# Patient Record
Sex: Male | Born: 1965
Health system: Southern US, Community
[De-identification: ages and names within clinical notes are randomized; demographics above are authoritative.]

## PROBLEM LIST (undated history)

## (undated) DIAGNOSIS — G4730 Sleep apnea, unspecified: Secondary | ICD-10-CM

## (undated) DIAGNOSIS — Z8639 Personal history of other endocrine, nutritional and metabolic disease: Secondary | ICD-10-CM

## (undated) DIAGNOSIS — I440 Atrioventricular block, first degree: Secondary | ICD-10-CM

## (undated) DIAGNOSIS — Z720 Tobacco use: Secondary | ICD-10-CM

## (undated) HISTORY — PX: MENISCUS REPAIR: SHX5179

## (undated) HISTORY — DX: Tobacco use: Z72.0

## (undated) HISTORY — DX: Personal history of other endocrine, nutritional and metabolic disease: Z86.39

## (undated) HISTORY — DX: Atrioventricular block, first degree: I44.0

## (undated) HISTORY — DX: Sleep apnea, unspecified: G47.30

## (undated) HISTORY — PX: TONSILLECTOMY: SUR1361

---

## 2004-09-09 DIAGNOSIS — Z8639 Personal history of other endocrine, nutritional and metabolic disease: Secondary | ICD-10-CM

## 2004-09-09 HISTORY — DX: Personal history of other endocrine, nutritional and metabolic disease: Z86.39

## 2004-09-09 HISTORY — PX: THYROIDECTOMY, PARTIAL: SHX18

## 2005-08-07 ENCOUNTER — Other Ambulatory Visit: Admission: RE | Admit: 2005-08-07 | Discharge: 2005-08-07 | Payer: Self-pay | Admitting: Interventional Radiology

## 2005-08-07 ENCOUNTER — Encounter: Admission: RE | Admit: 2005-08-07 | Discharge: 2005-08-07 | Payer: Self-pay | Admitting: Emergency Medicine

## 2005-08-07 ENCOUNTER — Encounter (INDEPENDENT_AMBULATORY_CARE_PROVIDER_SITE_OTHER): Payer: Self-pay | Admitting: Specialist

## 2005-08-21 ENCOUNTER — Ambulatory Visit (HOSPITAL_COMMUNITY): Admission: AD | Admit: 2005-08-21 | Discharge: 2005-08-22 | Payer: Self-pay | Admitting: Surgery

## 2005-08-21 ENCOUNTER — Encounter (INDEPENDENT_AMBULATORY_CARE_PROVIDER_SITE_OTHER): Payer: Self-pay | Admitting: *Deleted

## 2006-04-10 IMAGING — US US BIOPSY
1 series · 8 of 8 positions shown · non-contrast
Comparison: none

CLINICAL DATA: Right thyroid nodule noted on physical exam. 

ULTRASOUND GUIDED FNA BIOPSY THYROID:
TECHNIQUE: Overlying skin prepped with Betadine, draped in usual sterile
fashion, and infiltrated locally with 1% lidocaine. Under real-time ultrasound
guidance, 4 passes were made into the dominant right mid pole nodule using 25
gauge needles. Samples were given to cytopathology. No immediate complication.

[Series 1: unknown · 0.07mm/px · 8 of 8 slices shown]
[im 1/8]
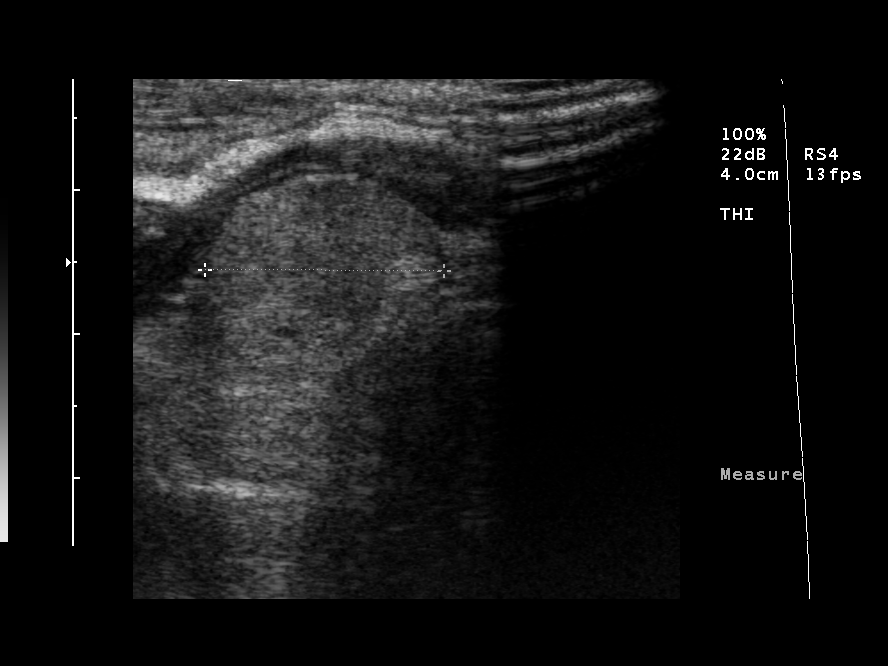
[im 2/8]
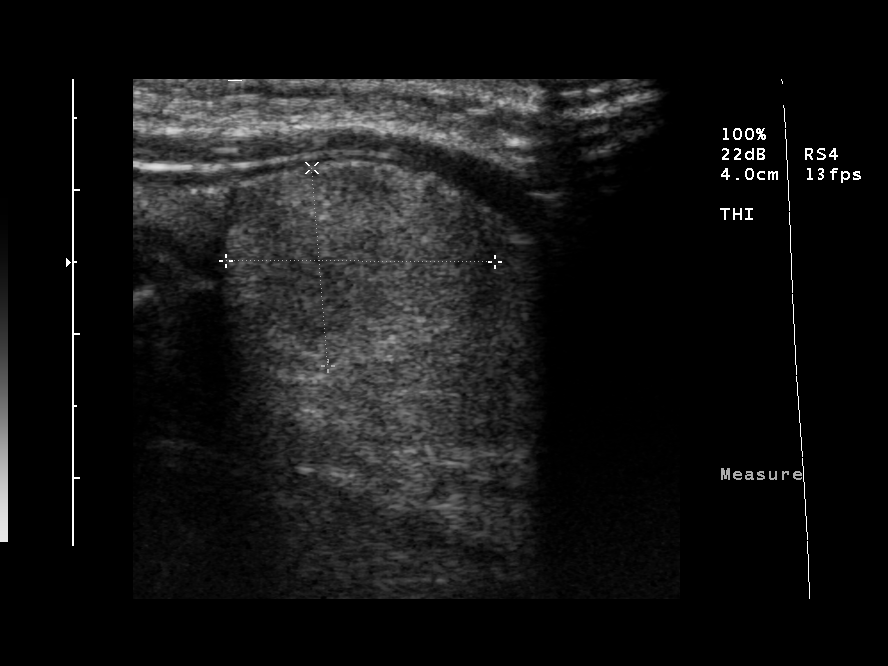
[im 3/8]
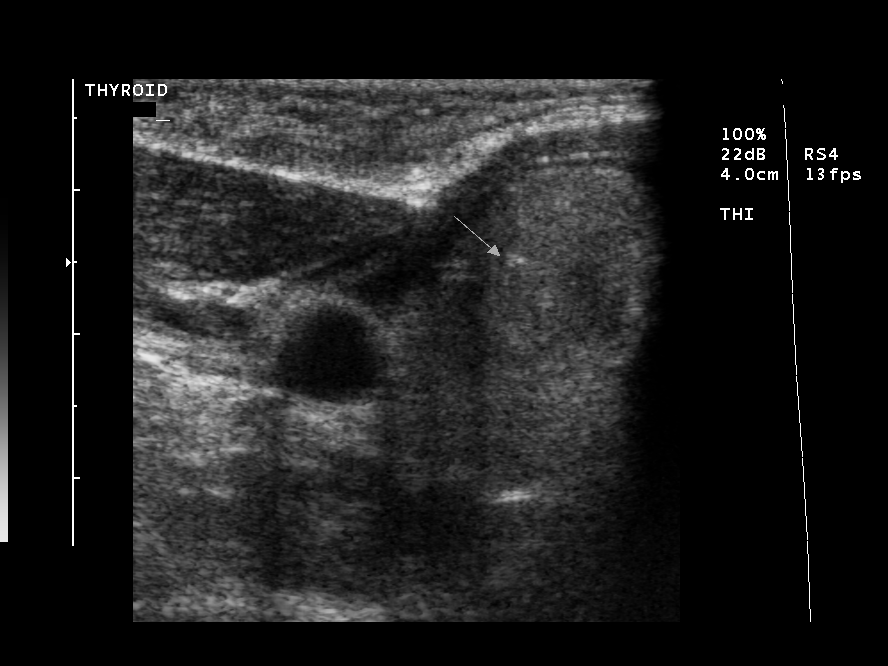
[im 4/8]
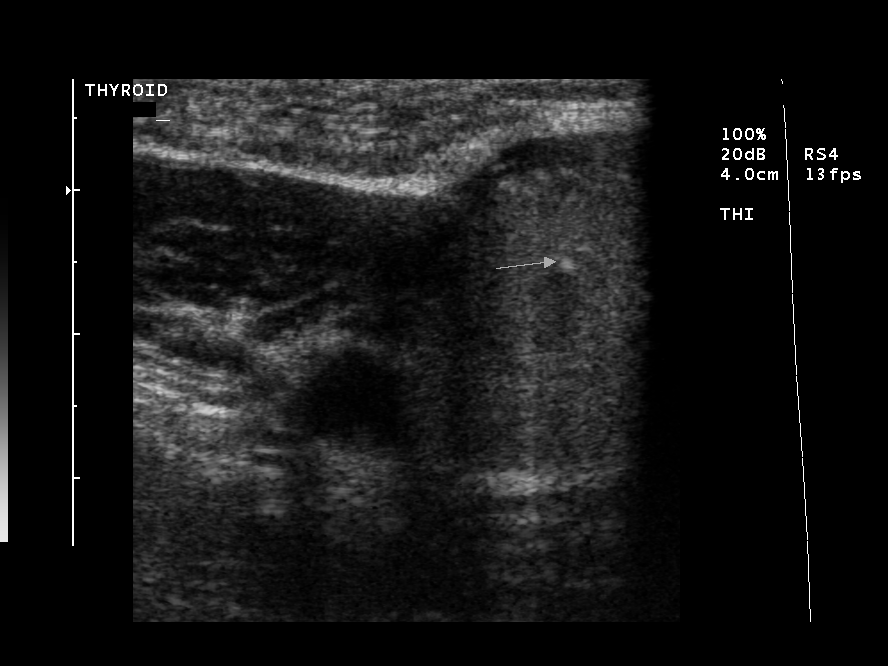
[im 5/8]
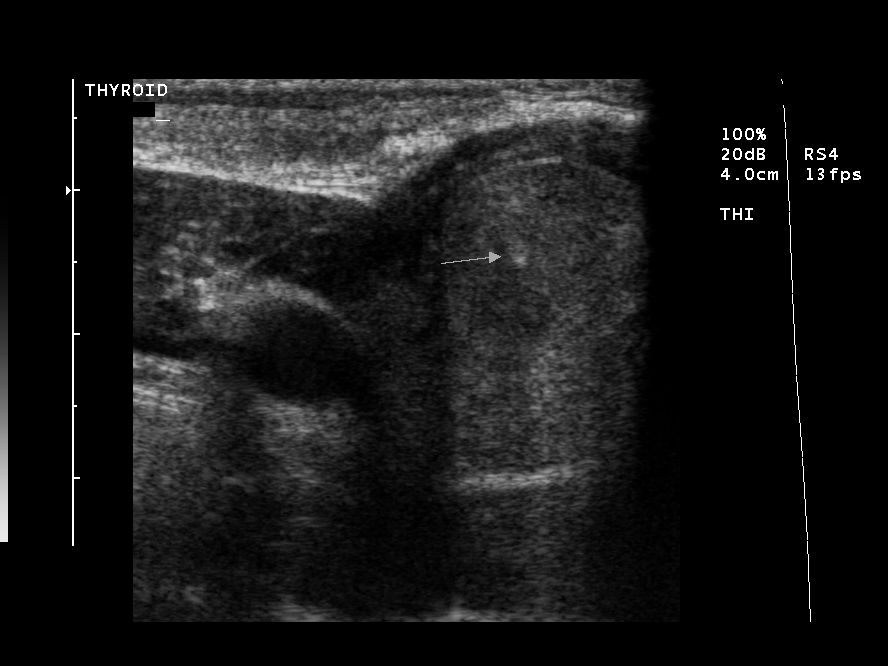
[im 6/8]
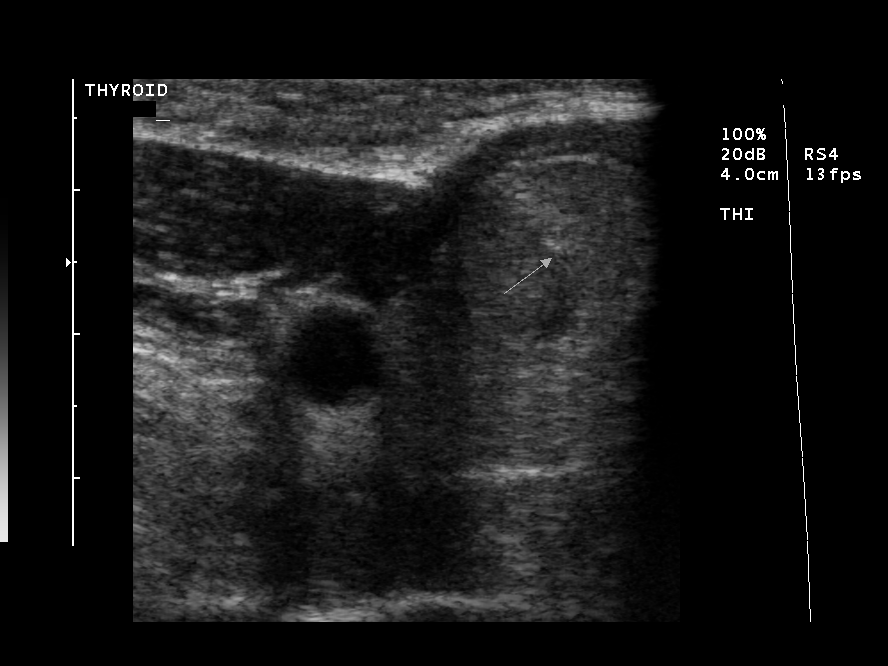
[im 7/8]
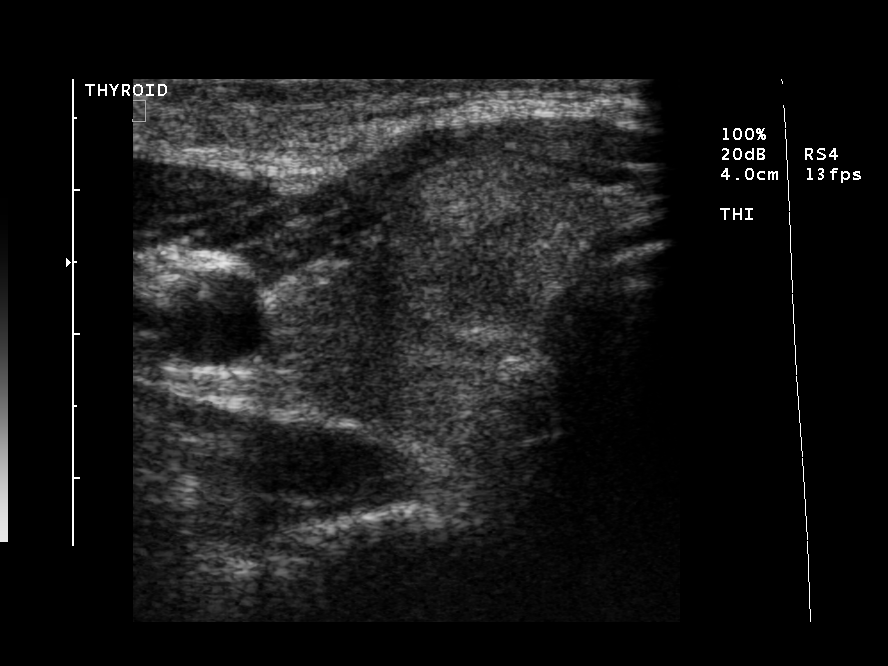
[im 8/8]
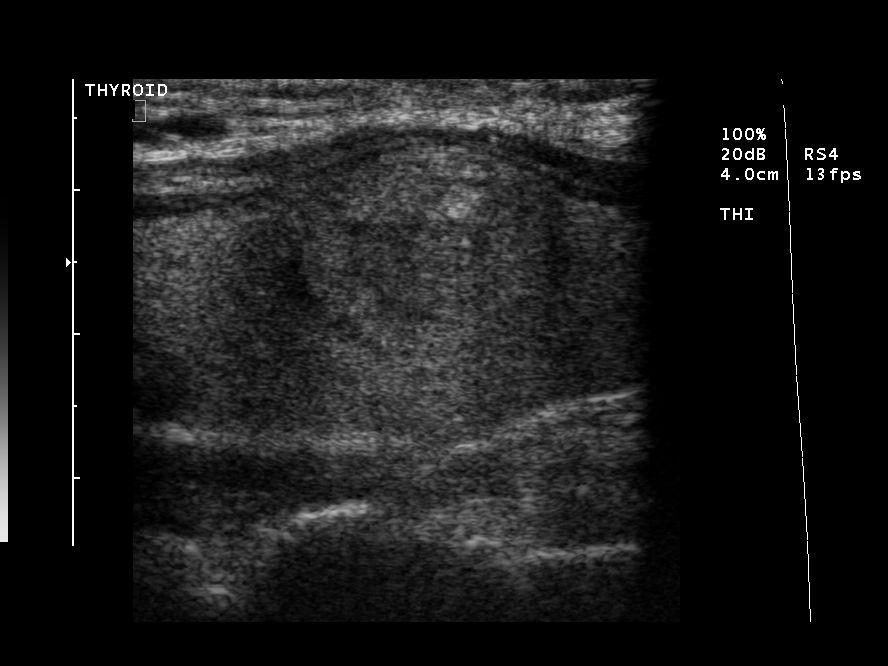

[8 of 8 positions shown; findings below may reference images not displayed]

IMPRESSION: Technically successful ultrasound guided FNA thyroid biopsy.

## 2007-01-28 ENCOUNTER — Ambulatory Visit: Payer: Self-pay | Admitting: Internal Medicine

## 2007-01-28 LAB — CONVERTED CEMR LAB
T3, Free: 2.9 pg/mL (ref 2.3–4.2)
T4, Total: 5.5 ug/dL (ref 5.0–12.5)
TSH: 3.08 microintl units/mL (ref 0.35–5.50)

## 2007-04-08 ENCOUNTER — Ambulatory Visit (HOSPITAL_BASED_OUTPATIENT_CLINIC_OR_DEPARTMENT_OTHER): Admission: RE | Admit: 2007-04-08 | Discharge: 2007-04-08 | Payer: Self-pay | Admitting: Internal Medicine

## 2007-04-16 ENCOUNTER — Ambulatory Visit: Payer: Self-pay | Admitting: Internal Medicine

## 2007-04-22 ENCOUNTER — Ambulatory Visit: Payer: Self-pay | Admitting: Pulmonary Disease

## 2007-07-21 ENCOUNTER — Encounter: Payer: Self-pay | Admitting: Internal Medicine

## 2007-11-26 ENCOUNTER — Encounter: Payer: Self-pay | Admitting: Internal Medicine

## 2007-11-27 ENCOUNTER — Encounter: Payer: Self-pay | Admitting: Internal Medicine

## 2008-05-17 ENCOUNTER — Ambulatory Visit: Payer: Self-pay | Admitting: Internal Medicine

## 2008-05-26 ENCOUNTER — Telehealth: Payer: Self-pay | Admitting: Internal Medicine

## 2008-09-16 ENCOUNTER — Ambulatory Visit (HOSPITAL_BASED_OUTPATIENT_CLINIC_OR_DEPARTMENT_OTHER): Admission: RE | Admit: 2008-09-16 | Discharge: 2008-09-16 | Payer: Self-pay | Admitting: Internal Medicine

## 2008-09-16 ENCOUNTER — Ambulatory Visit: Payer: Self-pay | Admitting: Internal Medicine

## 2008-09-16 ENCOUNTER — Ambulatory Visit: Payer: Self-pay | Admitting: Diagnostic Radiology

## 2008-09-16 DIAGNOSIS — J189 Pneumonia, unspecified organism: Secondary | ICD-10-CM | POA: Insufficient documentation

## 2008-09-20 ENCOUNTER — Telehealth: Payer: Self-pay | Admitting: Internal Medicine

## 2011-01-17 ENCOUNTER — Encounter: Payer: Self-pay | Admitting: Internal Medicine

## 2011-01-18 ENCOUNTER — Encounter: Payer: Self-pay | Admitting: Internal Medicine

## 2011-01-22 NOTE — Procedures (Signed)
NAMEHERNDON, GRILL            ACCOUNT NO.:  [id number]   MEDICAL RECORD NO.:  [id number]          PATIENT TYPE:  OUT   LOCATION:  SLEEP CENTER                 FACILITY:  [facility]   PHYSICIAN:  Barbaraann Share, MD,FCCPDATE OF BIRTH:  28-Sep-1967   DATE OF STUDY:  04/08/2007                            NOCTURNAL POLYSOMNOGRAM   REFERRING PHYSICIAN:   INDICATION FOR STUDY:  Hypersomnia with sleep apnea.   EPWORTH SLEEPINESS SCORE:  16   MEDICATIONS:   SLEEP ARCHITECTURE:  The patient had a total sleep time of 375 minutes  with very little slow wave sleep as well as REM.  Sleep onset latency  was normal as was REM onset.  Sleep efficiency was decreased at 85%.   RESPIRATORY DATA:  The patient was found to have 52 obstructive  hypopneas and 5 obstructive apneas for an apnea/hypopnea index of 9  events per hour.  The events occurred primarily in the supine position.  And there was moderate to loud snoring noted throughout.   OXYGEN DATA:  There was O2 desaturation as low as 88% with the patient's  obstructive events.   CARDIAC DATA:  Rare PVCs were noted but no clinically significant  arrhythmias.   MOVEMENT-PARASOMNIA:  The patient was found to have 28 leg jerks with 3  per hour resulting in arousal or awakening.  This is most likely due to  the patient's sleep disordered breathing.   IMPRESSIONS-RECOMMENDATIONS:  1. Mild obstructive sleep apnea/hypopnea syndrome with an      apnea/hypopnea index of 9 events per hour and oxygen desaturation      as low as 88%.  The events occurred primarily in the supine      position.  The patient's degree of sleep apnea may be somewhat      under estimated secondary to the small quantities of slow wave      sleep      and REM.  Treatment for this degree of sleep apnea can include      weight loss alone is applicable, upper airway surgery, oral      appliance, and also CPAP.  Clinical correlation is suggested.      Barbaraann Share,  MD,FCCP  Diplomate, American Board of Sleep  Medicine  Electronically Signed     KMC/MEDQ  D:  04/22/2007 16:22:34  T:  04/23/2007 20:46:09  Job:  [id number]

## 2011-01-22 NOTE — Assessment & Plan Note (Signed)
[facility]                           PRIMARY CARE OFFICE NOTE   NAME:Jeffrey Moses, Jeffrey Moses                   MRN:          [id number]  DATE:01/28/2007                            DOB:          07-20-1966    CHIEF COMPLAINT:  New patient to practice.   HISTORY OF PRESENT ILLNESS:  The patient is a 45 year old white male  here to establish primary care.  He has lived in [place] for the last  4 years.  He is originally from [place], [place] and previously in  [place] prior to moving to [place].  He works as a Clinical cytogeneticist.  He has been in good health for most of his life.  He notes  partial thyroidectomy in December 2006 due to right-sided goiter.  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  Final pathology from subtotal right thyroidectomy revealed  benign thyroid tissue, according to the patient.  He has not had  followup thyroid studies since that time; however, denies any weight  gain or any heat/cold intolerance or constipation.   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  He does admit  to daytime somnolence and easily nods off during the day.  He also  reports occasional a.m. headache.  He reports that he has tried to lose  approximately 10-15 pounds within the last 6 months, which has decreased  his snoring, but is interested in obtaining a sleep study.   His other concern is the chronic skin lesion on the tip of his penis  that he has had for many, many years.  He was previously seen by a  physician in [place], who recommended followup with a urologist; this  never happened.  He denies any symptoms.  It is not pruritic.  It has  slightly enlarged in size.   PAST MEDICAL HISTORY SUMMARY:  1. Partial right thyroidectomy, December  2006, for right-sided goiter.  2. Possible sleep apnea with excessive snoring.   CURRENT MEDICATIONS:  1. Multivitamin 3 times a week.  2. Omega-3 fatty acids 3 times per week.   ALLERGIES TO MEDICATIONS:  None known.   SOCIAL HISTORY:  The patient has been married for 10 years.  He has 2  children, ages 28 and 3 and currently works as a IT sales professional.   FAMILY HISTORY:  Mother and father are both in their 78s and are in good  health.  Grandmother is noted to be diabetic.  There is a report of skin  cancer in the family in his father.   HABITS:  He seldom drinks.  He does not smoke.  He did chew tobacco for  17 years; he quit in 2001.  He reports regular followup with his dentist  and no report of oral lesions/oral cancer.   REVIEW OF SYSTEMS:  As noted above, he denies any chest pain, shortness  of breath.  No heartburn, nausea, vomiting, constipation or diarrhea.  No  sexual dysfunction and all other systems negative.   PHYSICAL EXAMINATION:  VITALS:  Weight is 202 pounds.  Temperature is  96.7, pulse is 59, BP is 127/80 in the left arm in a seated position.  GENERAL:  The patient is a pleasant, well-developed, well-nourished 45-  year-old white male in no apparent distress.  HEENT:  Normocephalic, atraumatic.  Pupils are equal and reactive to  light bilaterally.  Extraocular motility was intact.  The patient was  anicteric.  Conjunctivae were within normal limits.  External auditory  canals and tympanic membranes were clear bilaterally.  Hearing was  grossly normal.  Oropharyngeal exam was benign.  His oropharynx was  somewhat crowded.  NECK:  Supple.  Unable to appreciate any thyromegaly or thyroid nodules.  No carotid bruit.  CHEST:  Normal inspiratory effort.  Chest was clear to auscultation  bilaterally, no rhonchi, rales or wheezing.  CARDIOVASCULAR:  Regular rate and rhythm.  No significant murmurs, rubs,  or gallops appreciated.  ABDOMEN:  Soft and nontender.  Positive  bowel sounds.  No organomegaly.  GENITALIA:  Raised papular lesions near the urethral meatus that have  somewhat purplish discoloration; there are approximately 5-6 papular  lesions surrounding his meatus.  No other skin lesions are noted in the  shaft of his penis and no testicular abnormalities are noted.  MUSCULOSKELETAL:  No clubbing, cyanosis, or edema.  NEUROLOGIC:  Cranial nerves II-XII were grossly intact.  He was nonfocal  and his mood and affect were appropriate.   IMPRESSION/RECOMMENDATION:  1. Excessive snoring, possible obstructive sleep apnea.  2. Penile skin lesions, rule out squamous cell cancer.  3. History of right partial thyroidectomy.  4. Health maintenance.   RECOMMENDATIONS:  The patient will be referred for outpatient sleep  study.  We discussed the possibility of using CPAP if he is positive for  sleep apnea.  He is to continue his weight loss efforts and hopefully  losing another 10 or 15 pounds may help his possible sleep apnea/snoring  symptoms.   In terms of his penile skin lesions, he will be referred to [facility] for followup/biopsy.   I reviewed his cholesterol studies; his LDL was mildly elevated in the  150s.  I recommended a low-saturated-fat diet.  Followup time is in  approximately 2 months.    Barbette Hair. Artist Pais, DO  Electronically Signed   RDY/MedQ  DD: 01/28/2007  DT: 01/28/2007  Job #: [id number]

## 2011-01-25 NOTE — Op Note (Signed)
Jeffrey Moses, WEISSINGER            ACCOUNT NO.:  [id number]   MEDICAL RECORD NO.:  [id number]          PATIENT TYPE:  OIB   LOCATION:  5729                         FACILITY:  [facility]   PHYSICIAN:  Thornton Park. Daphine Deutscher, MD  DATE OF BIRTH:  1966-05-02   DATE OF PROCEDURE:  08/21/2005  DATE OF DISCHARGE:                                 OPERATIVE REPORT   PREOPERATIVE DIAGNOSIS:  Right thyroid nodule.   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   PROCEDURE:  Right thyroid lobectomy and isthmusectomy.   SURGEON:  Thornton Park. Daphine Deutscher, MD.   ASSISTANTMarland Kitchen  Baruch Merl.   ANESTHESIA:  General endotracheal.   DESCRIPTION OF PROCEDURE:  The patient was taken to room 17 and given  general anesthesia. The patient was placed with a roll between his shoulders  and then hyperextended. Approximately 2-1/2 fingerbreadths above the sternal  notch I made a transverse incision, initially just from the  sternocleidomastoid muscles.  I then extended it and carried this down to  and through the platysma muscle. I raised the flaps superiorly and  inferiorly and put in the Horner retractor. The midline was identified and  incised, and then I got in beneath the strap muscles and retracted those  laterally and mobilized the right thyroid. I used a Pension scheme manager to  tease the thyroid from the surrounding tissue. Laterally I stayed right on  the gland and ligated the vascular supply to the inferior thyroid, middle  thyroid vein, and also the superior polar vessels with a combination of  small clips and 2-0 silk ties. Near where the nerve would be entering, I  stayed right on the gland and again teased it away; and eventually  mobilizing anteriorly across into the isthmus, where I took it with a Kelly  clamp (which I resewed with a 2-0 Vicryl). A piece of Surgicel was placed  down within the area of the inferior thyroid vessels. I had spared the  parathyroid glands and no  bleeding was noted. I waited until the frozen  section was called. Surgicel was left in the wound and I closed the strap  muscles with 4-0 Vicryls.  Then I closed the subcutaneous tissue with 4-0  Vicryls subcutaneously, and then subcuticularly and  then Benzoin and Steri-Strips on the skin. The patient seemed to tolerate  this procedure well. He was  awakened and taken to recovery room in  satisfactory addition.   FROZEN SECTION DIAGNOSIS:  Benign right thyroid mass, status post thyroid  lobectomy and isthmusectomy.      Thornton Park Daphine Deutscher, MD  Electronically Signed     MBM/MEDQ  D:  08/21/2005  T:  08/22/2005  Job:  [id number]   cc:   Brett Canales A. Cleta Alberts, M.D.  Fax: [phone]

## 2011-01-29 ENCOUNTER — Encounter: Payer: Self-pay | Admitting: Internal Medicine

## 2011-02-12 ENCOUNTER — Encounter: Payer: Self-pay | Admitting: Internal Medicine

## 2011-04-16 ENCOUNTER — Ambulatory Visit (HOSPITAL_BASED_OUTPATIENT_CLINIC_OR_DEPARTMENT_OTHER)
Admission: RE | Admit: 2011-04-16 | Discharge: 2011-04-16 | Disposition: A | Discharge status: Home / Self Care | Payer: 59 | Source: Ambulatory Visit | Attending: Family | Admitting: Family

## 2011-04-16 ENCOUNTER — Telehealth: Payer: Self-pay | Admitting: Family

## 2011-04-16 ENCOUNTER — Other Ambulatory Visit: Payer: Self-pay | Admitting: Family

## 2011-04-16 ENCOUNTER — Ambulatory Visit (INDEPENDENT_AMBULATORY_CARE_PROVIDER_SITE_OTHER): Payer: 59 | Admitting: Family

## 2011-04-16 ENCOUNTER — Encounter: Payer: Self-pay | Admitting: Family

## 2011-04-16 ENCOUNTER — Ambulatory Visit (INDEPENDENT_AMBULATORY_CARE_PROVIDER_SITE_OTHER)
Admission: RE | Admit: 2011-04-16 | Discharge: 2011-04-16 | Disposition: A | Discharge status: Home / Self Care | Payer: 59 | Source: Ambulatory Visit | Attending: Family | Admitting: Family

## 2011-04-16 VITALS — BP 108/78 | HR 78 | Temp 97.5°F | Resp 16 | Ht 74.02 in | Wt 218.1 lb

## 2011-04-16 DIAGNOSIS — N453 Epididymo-orchitis: Secondary | ICD-10-CM | POA: Insufficient documentation

## 2011-04-16 DIAGNOSIS — N509 Disorder of male genital organs, unspecified: Secondary | ICD-10-CM

## 2011-04-16 DIAGNOSIS — N50811 Right testicular pain: Secondary | ICD-10-CM

## 2011-04-16 MED ORDER — CEFTRIAXONE SODIUM 250 MG IJ SOLR
250.0000 mg | Freq: Once | INTRAMUSCULAR | Status: AC
  Administered 2011-04-16: 250 mg via INTRAMUSCULAR

## 2011-04-16 MED ORDER — LEVOFLOXACIN 500 MG PO TABS: 500.0000 mg | ORAL_TABLET | Freq: Every day | ORAL | Status: DC

## 2011-04-16 NOTE — Patient Instructions (Signed)
Please go directly to imaging department complete scrotal ultrasound on the first floor (bear left off of elevators). Start Levaquin today (rx was sent to [facility]). Call if worsening pain, swelling, or if fever >101. Follow up in 1 week.

## 2011-04-16 NOTE — Progress Notes (Signed)
  Subjective:    Patient ID: Jeffrey Moses, male    DOB: Oct 06, 1965, 45 y.o.   MRN: [id number]  HPI    Review of Systems     Objective:   Physical Exam        Assessment & Plan:  Reviewed results of scrotal ultrasound: right epididymo-orchitis.  Pt informed of results.  Case and plan was reviewed with Dr. Rodena Medin.

## 2011-04-16 NOTE — Assessment & Plan Note (Signed)
Suspect orchitis.  Will plan to treat with IM ceftriaxone- given in office today, followed by a 10 day course of levaquin. Check scrotal ultrasound to exclude testicular torsion and further evaluate.  Pt to f/u in 1 week.

## 2011-04-16 NOTE — Telephone Encounter (Signed)
Reviewed findings of ultrasound with pt.  Recommended that he start levaquin ASAP and contact us if symptoms worsen or if they do not improve in 2-3 days.  Pt verbalizes understanding.

## 2011-04-16 NOTE — Progress Notes (Signed)
  Subjective:    Patient ID: Jeffrey Moses, male    DOB: 17-Jun-1966, 44 y.o.   MRN: [id number]  HPI  Mr.  Moses is a 45 yr old male who presents chief complaint of right testicular swelling.  Symptoms started 24 hours ago.  Denies fever, dysuria, polyuria.  Denies inguinal LAD.  No new sexual partners.  Denies previous history of similar symptoms.  Denies trauma or injury to the right testicle.     Review of Systems See HPI  Past Medical History  Diagnosis Date  . History of thyroid nodule 2006    benign  . Chewing tobacco use     history of chewing tobacco x 17 years  . Heart block, first degree     History   Social History  . Marital Status: Married    Spouse Name: N/A    Number of Children: N/A  . Years of Education: N/A   Occupational History  . Not on file.   Social History Main Topics  . Smoking status: Never Smoker   . Smokeless tobacco: Former Neurosurgeon    Types: Chew    Quit date: 09/10/1999  . Alcohol Use: Not on file     seldom  . Drug Use: Not on file  . Sexually Active: Not on file   Other Topics Concern  . Not on file   Social History Narrative     Married   Three children 74, 27, 30 month old   Firefighter   Alcohol - seldom   Previously chewed tobacco x 17 yrs.  Quit 2001     Past Surgical History  Procedure Date  . Thyroidectomy, partial 2006    Family History  Problem Relation Age of Onset  . Diabetes    . Cancer Other     skin    No Known Allergies  No current outpatient prescriptions on file prior to visit.    BP 108/78  Pulse 78  Temp(Src) 97.5 F (36.4 C) (Oral)  Resp 16  Ht 6' 2.02" (1.88 m)  Wt 218 lb 1.3 oz (98.92 kg)  BMI 27.99 kg/m2       Objective:   Physical Exam  Constitutional: He appears well-developed and well-nourished.  Genitourinary: Penis normal.             Assessment & Plan:

## 2011-04-17 ENCOUNTER — Telehealth: Payer: Self-pay | Admitting: Family

## 2011-04-17 LAB — GC/CHLAMYDIA PROBE AMP, URINE
Chlamydia, Swab/Urine, PCR: NEGATIVE
GC Probe Amp, Urine: NEGATIVE

## 2011-04-17 NOTE — Telephone Encounter (Signed)
Called patient to see how he was feeling.  He told me that his symptoms are improving.  Asked pt to call if his symptoms do not continue to improve.  He verbalized understanding.

## 2011-04-18 ENCOUNTER — Telehealth: Payer: Self-pay | Admitting: *Deleted

## 2011-04-18 ENCOUNTER — Telehealth: Payer: Self-pay | Admitting: Family

## 2011-04-18 LAB — URINE CULTURE
Colony Count: NO GROWTH
Organism ID, Bacteria: NO GROWTH

## 2011-04-18 NOTE — Telephone Encounter (Signed)
Message copied by Kathi Simpers on Thu Apr 18, 2011  1:51 PM ------      Message from: [name], MELISSA      Created: Wed Apr 17, 2011  3:19 PM       Could you pls make sure that his urine culture is pending? thanks

## 2011-04-18 NOTE — Telephone Encounter (Signed)
Please notify pt that his gonorrhea/chlamydia testing was normal.

## 2011-04-18 NOTE — Telephone Encounter (Signed)
Pt.notified

## 2011-04-18 NOTE — Telephone Encounter (Signed)
Spoke to Manjul at [facility] and was told that urine culture is still pending. Was transferred to microbiology voicemail, left message to call us with status of culture.

## 2011-04-19 NOTE — Telephone Encounter (Signed)
Call placed to [facility] [phone], spoke with Nida Boatman he stated there were final results, which were requested to be faxed.

## 2011-04-19 NOTE — Telephone Encounter (Signed)
Lab results received - colony count-no growth noted, final report no report

## 2011-04-23 ENCOUNTER — Telehealth: Payer: Self-pay | Admitting: Internal Medicine

## 2011-04-23 NOTE — Telephone Encounter (Signed)
Patient states that he saw Melissa last week and was given an antibiotic(levofloxacin?). Patient states he took 4-5 days worth of antibiotic and lost the rest over the weekend. Patient wanted to make sure that it was okay that he didn't finish the antibiotic or if new rx needed to be called in.

## 2011-04-24 MED ORDER — LEVOFLOXACIN 500 MG PO TABS: 500.0000 mg | ORAL_TABLET | Freq: Every day | ORAL | Status: DC

## 2011-04-24 NOTE — Telephone Encounter (Signed)
I would like for him to complete a 10 day course of levaquin.  I have sent an additional 6 tabs to his pharmacy for him to pick up. He should be seen back in the office for follow up this week.

## 2011-04-24 NOTE — Telephone Encounter (Signed)
Patient returned phone call. Patient states that he will pickup rx from pharmacy and he made a follow up appt for 04-29-11

## 2011-04-24 NOTE — Telephone Encounter (Signed)
Left a message for pt to return my call. 

## 2011-04-29 ENCOUNTER — Ambulatory Visit (INDEPENDENT_AMBULATORY_CARE_PROVIDER_SITE_OTHER): Payer: 59 | Admitting: Family

## 2011-04-29 ENCOUNTER — Encounter: Payer: Self-pay | Admitting: Family

## 2011-04-29 DIAGNOSIS — N453 Epididymo-orchitis: Secondary | ICD-10-CM

## 2011-04-29 DIAGNOSIS — E785 Hyperlipidemia, unspecified: Secondary | ICD-10-CM

## 2011-04-29 NOTE — Assessment & Plan Note (Signed)
We discussed the importance of a low fat/low cholesterol diet and exercise.  He plans to see Dr. Artist Pais at the [facility] office and I have advised him to follow up with him in 6 months to re-evaluate his cholesterol and progress.  He verbalizes understanding.  15 minutes spent with pt today. >50% of this time was spent counseling pt on his diet, exercise and epididymo-orchitis.

## 2011-04-29 NOTE — Patient Instructions (Signed)
Work hard on a low fat/low cholesterol diet and exerise. Call if recurrent swelling or testicular pain.

## 2011-04-29 NOTE — Assessment & Plan Note (Signed)
Clinically resolved.  GC/chlamydia, urine culture results negative and were reviewed with pt.

## 2011-04-29 NOTE — Progress Notes (Signed)
  Subjective:    Patient ID: Jeffrey Moses, male    DOB: Sep 09, 1966, 45 y.o.   MRN: [id number]  HPI  Jeffrey Moses is a 45 yr old male who presents today for follow up of his right  Epididymo-orchitis.  He initially completed about 4 days of levaquin, but then lost his prescription.  A new Rx was sent to his pharmacy which he has completed.  He reports complete resolution of his right testicular pain and swelling. Denies fevers.   Denies dysuria.  Feels much better.   Hyperlipidemia- he brings with him today a lipid panel performed by his employer which notes total cholesterol of 265 and an LDL of 180.   Review of Systems See HPI  Past Medical History  Diagnosis Date  . History of thyroid nodule 2006    benign  . Chewing tobacco use     history of chewing tobacco x 17 years  . Heart block, first degree     History   Social History  . Marital Status: Married    Spouse Name: N/A    Number of Children: N/A  . Years of Education: N/A   Occupational History  . Not on file.   Social History Main Topics  . Smoking status: Never Smoker   . Smokeless tobacco: Former Neurosurgeon    Types: Chew    Quit date: 09/10/1999  . Alcohol Use: Not on file     seldom  . Drug Use: Not on file  . Sexually Active: Not on file   Other Topics Concern  . 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  Quit 2001     Past Surgical History  Procedure Date  . Thyroidectomy, partial 2006    Family History  Problem Relation Age of Onset  . Diabetes    . Cancer Other     skin    No Known Allergies  No current outpatient prescriptions on file prior to visit.    BP 102/78  Pulse 60  Temp(Src) 97.8 F (36.6 C) (Oral)  Resp 16  Ht 6\' 2"  (1.88 m)  Wt 217 lb 1.9 oz (98.485 kg)  BMI 27.88 kg/m2       Objective:   Physical Exam  Constitutional: He appears well-developed and  well-nourished.  Cardiovascular: Normal rate and regular rhythm.   Pulmonary/Chest: Effort normal.  Musculoskeletal: He exhibits no edema.  Psychiatric: He has a normal mood and affect. His behavior is normal. Judgment and thought content normal.          Assessment & Plan:

## 2011-04-30 NOTE — Telephone Encounter (Signed)
Pt was seen in follow up on 04/30/11.

## 2011-12-18 IMAGING — US US SCROTUM
1 series · 13 of 25 positions shown · non-contrast
Comparison: No similar comparison exam at this institution.

CLINICAL DATA: Right testicular pain for 1-2 days

SCROTAL ULTRASOUND
DOPPLER ULTRASOUND OF THE TESTICLES
TECHNIQUE: Complete ultrasound examination of the testicles,
epididymis, and other scrotal structures was performed.  Color and
spectral Doppler ultrasound were also utilized to evaluate blood
flow to the testicles.

[Series 1: us scrotum · 0.08mm/px · 13 of 49 slices shown]
[im 1/49]
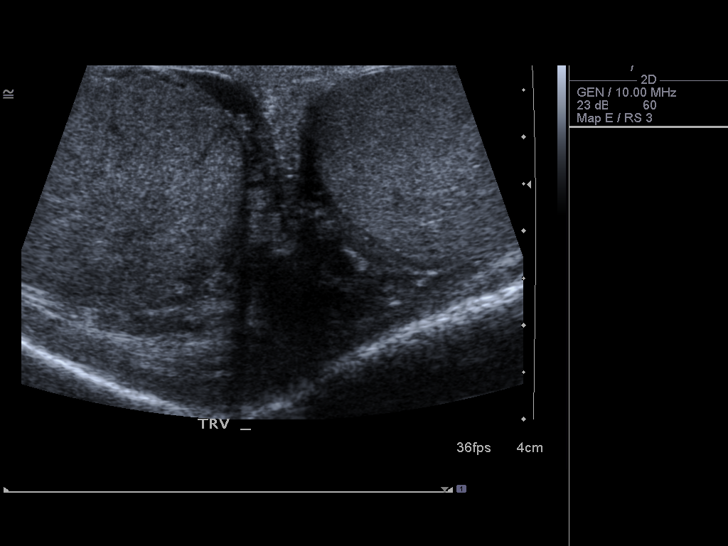
[im 5/49]
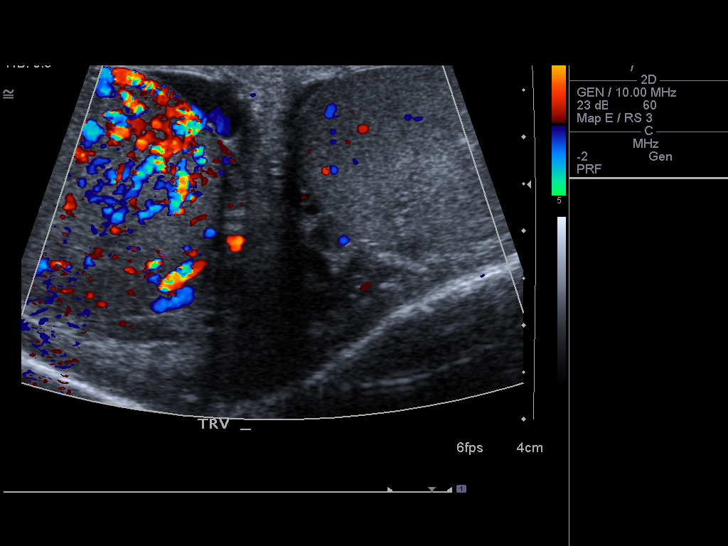
[im 9/49]
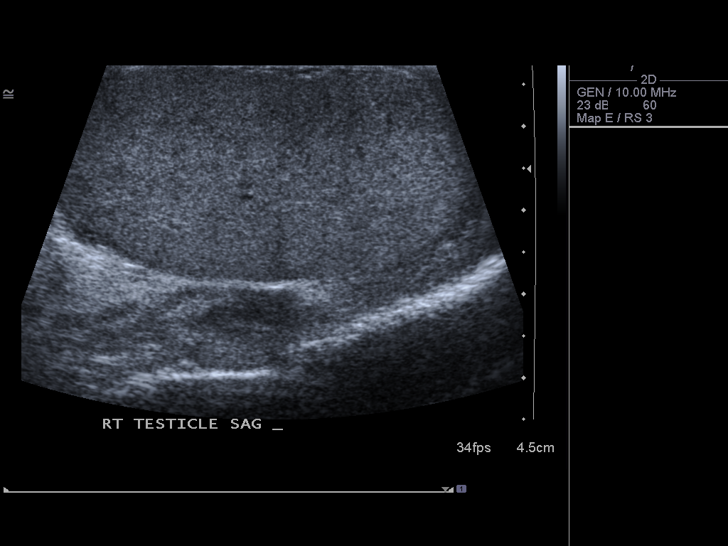
[im 13/49]
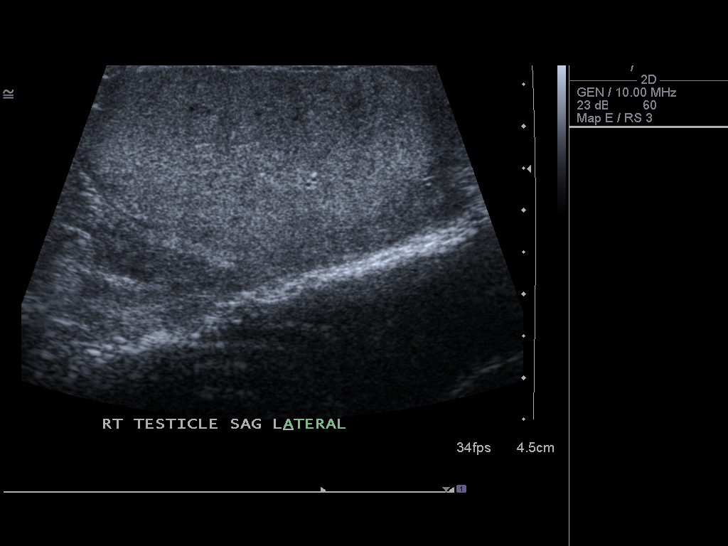
[im 17/49]
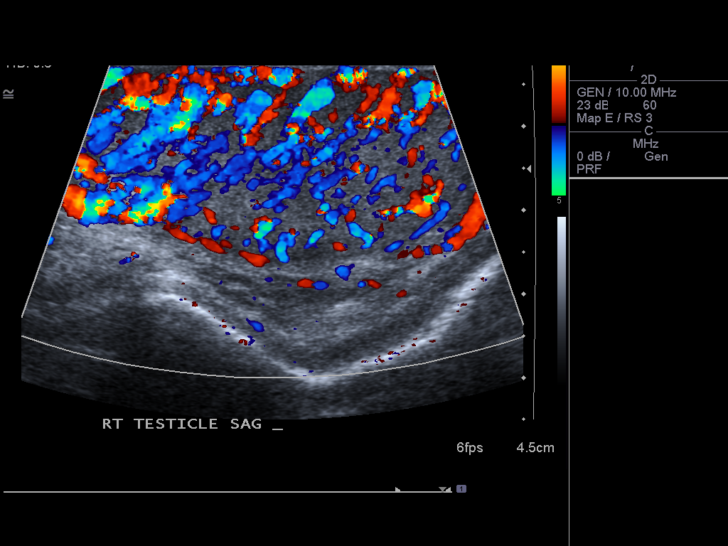
[im 21/49]
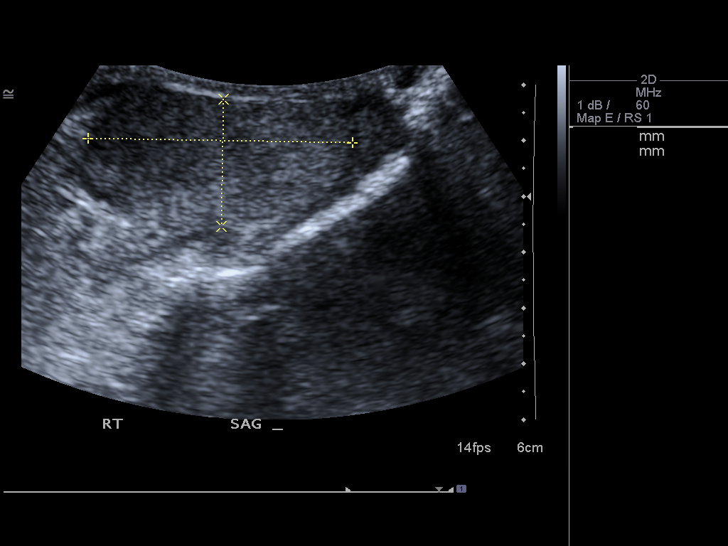
[im 25/49]
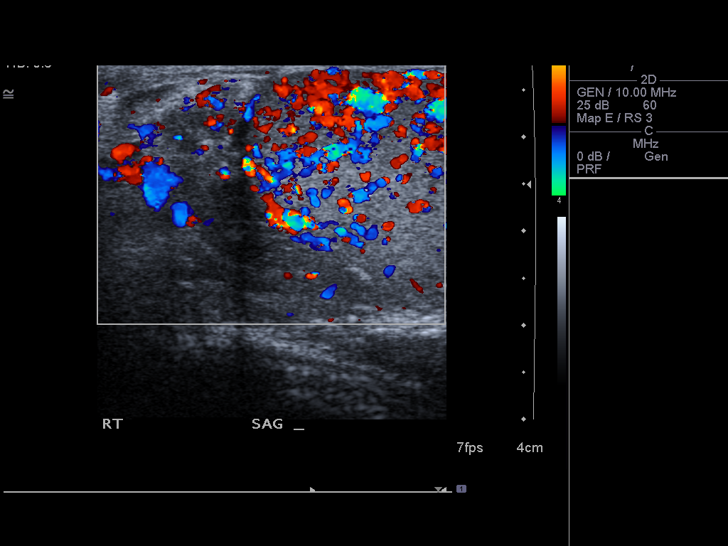
[im 29/49]
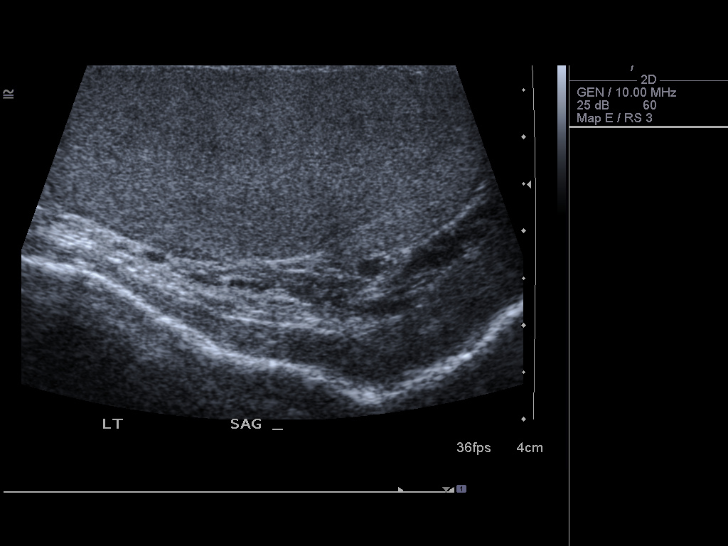
[im 33/49]
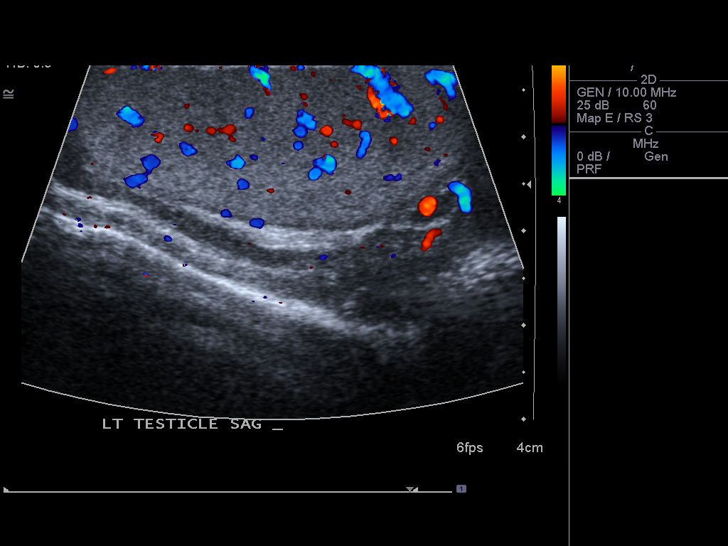
[im 37/49]
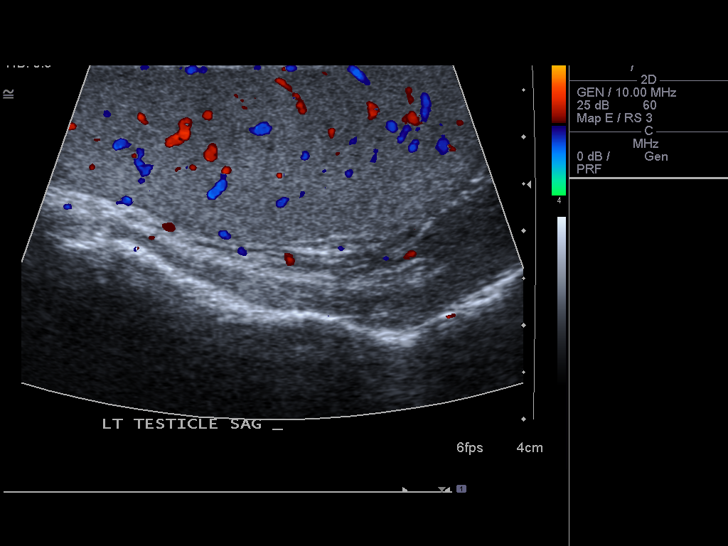
[im 41/49]
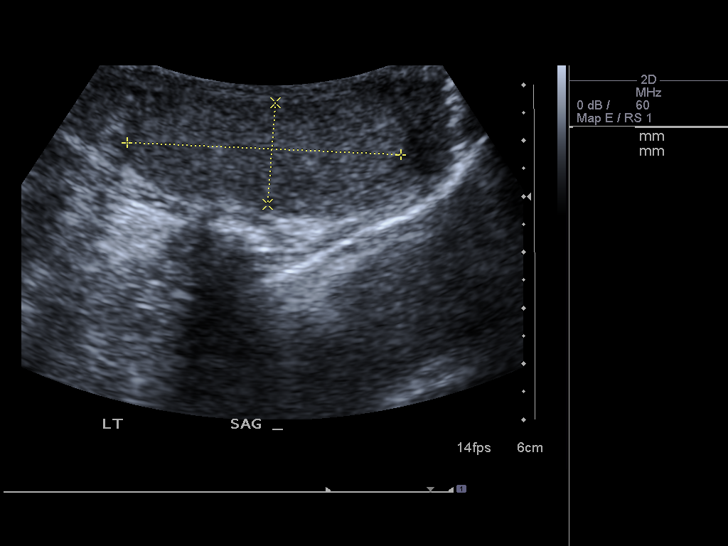
[im 45/49]
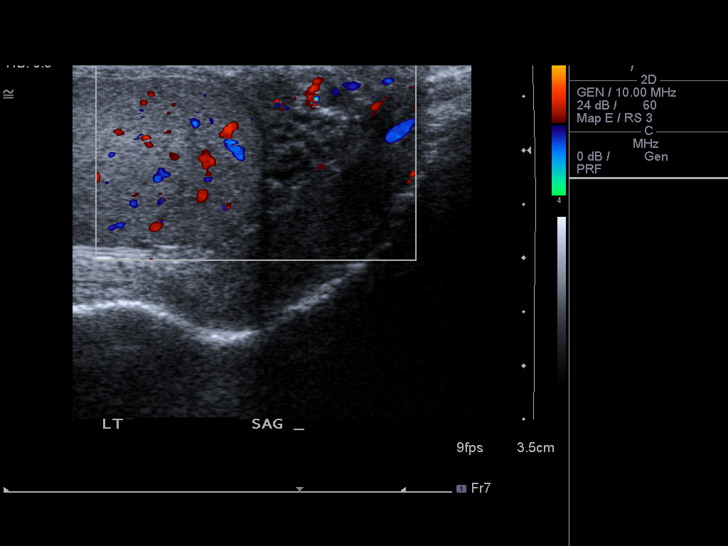
[im 49/49]
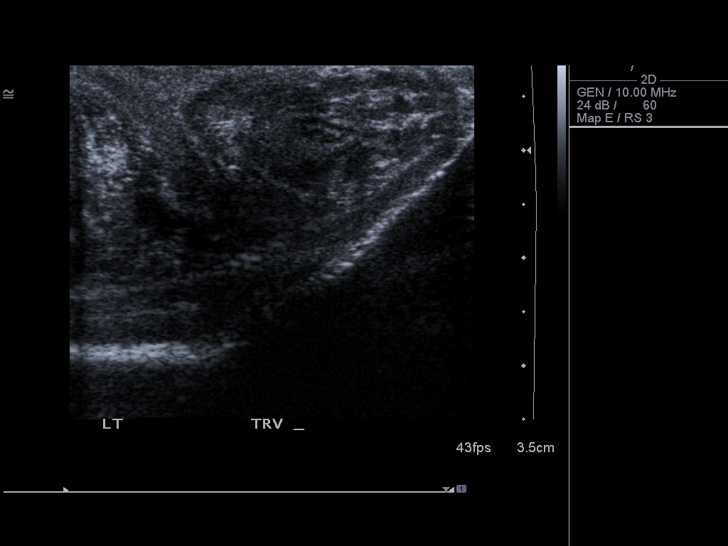

[13 of 25 positions shown; findings below may reference images not displayed]

FINDINGS: Right testis:  4.7 x 3.2 x 2.3 cm.  No intratesticular mass.
Although the testis is homogeneous in echogenicity and comparable
to the echogenicity of the left testis, there is diffusely
increased color flow throughout the right testis.

Left testis:  4.9 x 2.7 x 1.8 cm.  No intratesticular mass lesion.

Right epididymis:  No focal abnormality, although diffusely
prominent color flow is noted internally.

Left epididymis:  Normal in size and appearance.

Hydocele:  Absent

Varicocele:  Absent

Pulsed Doppler interrogation of both testes demonstrates low
resistance arterial and venous wave forms bilaterally.
IMPRESSION: Increased color flow within the right epididymis and testis,
compatible with epididymo-orchitis. No intratesticular mass is seen
on either side.

No sonographic evidence for torsion.Critical Value/emergent results
were called by telephone at the time of interpretation on
04/16/2011  at [DATE] p.m.  to  Turam Payra, who verbally
acknowledged these results.

## 2012-02-11 ENCOUNTER — Encounter: Payer: Self-pay | Admitting: Family

## 2012-02-11 ENCOUNTER — Ambulatory Visit (INDEPENDENT_AMBULATORY_CARE_PROVIDER_SITE_OTHER): Payer: 59 | Admitting: Family

## 2012-02-11 VITALS — BP 112/80 | HR 76 | Temp 97.8°F | Resp 16 | Ht 74.0 in | Wt 218.0 lb

## 2012-02-11 DIAGNOSIS — G4730 Sleep apnea, unspecified: Secondary | ICD-10-CM | POA: Insufficient documentation

## 2012-02-11 DIAGNOSIS — L255 Unspecified contact dermatitis due to plants, except food: Secondary | ICD-10-CM

## 2012-02-11 DIAGNOSIS — G4733 Obstructive sleep apnea (adult) (pediatric): Secondary | ICD-10-CM

## 2012-02-11 DIAGNOSIS — L237 Allergic contact dermatitis due to plants, except food: Secondary | ICD-10-CM

## 2012-02-11 MED ORDER — METHYLPREDNISOLONE SODIUM SUCC 125 MG IJ SOLR
125.0000 mg | Freq: Once | INTRAMUSCULAR | Status: AC
  Administered 2012-02-11: 125 mg via INTRAMUSCULAR

## 2012-02-11 MED ORDER — PREDNISONE 10 MG PO TABS: ORAL_TABLET | ORAL | Status: DC

## 2012-02-11 NOTE — Progress Notes (Signed)
Addended by: Mervin Kung A on: 02/11/2012 09:38 AM   Modules accepted: Orders

## 2012-02-11 NOTE — Assessment & Plan Note (Signed)
Pt given solumedrol IM in the office, to be followed by a prednisone taper.

## 2012-02-11 NOTE — Patient Instructions (Signed)
You will be contact about your referral for sleep study.  Please let us know if you have not heard back within 1 week about your referral.  Poison [facility] ivy is a rash caused by touching the leaves of the poison ivy plant. The rash often shows up 48 hours later. You might just have bumps, redness, and itching. Sometimes, blisters appear and break open. Your eyes may get puffy (swollen). Poison ivy often heals in 2 to 3 weeks without treatment. HOME CARE  If you touch poison ivy:   Wash your skin with soap and water right away. Wash under your fingernails. Do not rub the skin very hard.   Wash any clothes you were wearing.   Avoid poison ivy in the future. Poison ivy has 3 leaves on a stem.   Use medicine to help with itching as told by your doctor. Do not drive when you take this medicine.   Keep open sores dry, clean, and covered with a bandage and medicated cream, if needed.   Ask your doctor about medicine for children.  GET HELP RIGHT AWAY IF:  You have open sores.   Redness spreads beyond the area of the rash.   There is yellowish white fluid (pus) coming from the rash.   Pain gets worse.   You have a temperature by mouth above 102 F (38.9 C), not controlled by medicine.  MAKE SURE YOU:  Understand these instructions.   Will watch your condition.   Will get help right away if you are not doing well or get worse.  Document Released: 09/28/2010 Document Revised: 08/15/2011 Document Reviewed: 09/28/2010 [facility] Patient Information 2012 [place], [place].

## 2012-02-11 NOTE — Assessment & Plan Note (Addendum)
Deteriorated. 46 yr old male with hx of osa, now with worsening daytime fatigue.  Will refer for split night study and plan to initiate cpap if indicated.

## 2012-02-11 NOTE — Progress Notes (Signed)
  Subjective:    Patient ID: Jeffrey Moses, male    DOB: 01/28/1966, 46 y.o.   MRN: [id number]  HPI  Mr.  Jeffrey Moses is a 46 yr old male who presents today with two concerns.  Poison Ivy x 1 week.  Using allergy medication including benadryl and claritin.    Snoring-  Wife is concerned about OSA. He reports that he had a sleep study performed 6 years ago which was "borderline."  He reports that he is frequently tired during the day. Can doze off easily.  Naps every day.  Wife and his co-workers at the [employer] complain about his snorning. Wants to have this re-evaluated.  He is willing to use CPAP if necessary.   Review of Systems See HPI  Past Medical History  Diagnosis Date  . History of thyroid nodule 2006    benign  . Chewing tobacco use     history of chewing tobacco x 17 years  . Heart block, first degree     History   Social History  . Marital Status: Married    Spouse Name: N/A    Number of Children: N/A  . Years of Education: N/A   Occupational History  . Not on file.   Social History Main Topics  . Smoking status: Never Smoker   . Smokeless tobacco: Former Neurosurgeon    Types: Chew    Quit date: 09/10/1999  . Alcohol Use: Not on file     seldom  . Drug Use: Not on file  . Sexually Active: Not on file   Other Topics Concern  . Not on file   Social History Narrative     Married   Three children 9, 37, 68 month old   Firefighter   Alcohol - seldom   Previously chewed tobacco x 17 yrs.  Quit 2001     Past Surgical History  Procedure Date  . Thyroidectomy, partial 2006    Family History  Problem Relation Age of Onset  . Diabetes    . Cancer Other     skin    No Known Allergies  No current outpatient prescriptions on file prior to visit.    BP 112/80  Pulse 76  Temp(Src) 97.8 F (36.6 C) (Oral)  Resp 16  Ht 6\' 2"  (1.88 m)  Wt 218 lb (98.884 kg)  BMI 27.99 kg/m2  SpO2 96%       Objective:   Physical Exam  Constitutional: He appears  well-developed and well-nourished. No distress.  HENT:  Head: Normocephalic and atraumatic.  Cardiovascular: Normal rate and regular rhythm.   No murmur heard. Pulmonary/Chest: Effort normal and breath sounds normal. No respiratory distress. He has no wheezes. He has no rales. He exhibits no tenderness.  Musculoskeletal: He exhibits no edema.  Skin:       Blistering rash in patches bilateral forearms.   Psychiatric: He has a normal mood and affect. His behavior is normal. Judgment and thought content normal.          Assessment & Plan:

## 2012-02-20 ENCOUNTER — Ambulatory Visit (HOSPITAL_BASED_OUTPATIENT_CLINIC_OR_DEPARTMENT_OTHER): Payer: 59 | Attending: Family

## 2012-02-20 VITALS — Ht 74.0 in | Wt 215.0 lb

## 2012-02-20 DIAGNOSIS — G4733 Obstructive sleep apnea (adult) (pediatric): Secondary | ICD-10-CM | POA: Insufficient documentation

## 2012-03-07 DIAGNOSIS — G4733 Obstructive sleep apnea (adult) (pediatric): Secondary | ICD-10-CM

## 2012-03-07 NOTE — Procedures (Signed)
Jeffrey Moses, Jeffrey Moses NO.:  [id number]  MEDICAL RECORD NO.:  [id number]          PATIENT TYPE:  OUT  LOCATION:  SLEEP CENTER                 FACILITY:  [facility]  PHYSICIAN:  Oretha Milch, MD      DATE OF BIRTH:  1966/09/06  DATE OF STUDY:  02/20/2012                           NOCTURNAL POLYSOMNOGRAM  REFERRING PHYSICIAN:  Sandford Craze, NP  INDICATION FOR STUDY:  Jeffrey Moses is a 46 year old gentleman with loud snoring noted by his wife and coworkers at [employer].  The sleep study performed 6 years ago, apparently was "borderline" with excessive daytime somnolence and tiredness.  At the time of this study, he weighed 215 pounds with a height of 6 feet and 2 inches, BMI of 28, neck size of 15.  EPWORTH SLEEPINESS SCORE:  16.  This nocturnal polysomnogram was performed with a sleep technologist in attendance.  EEG, EOG, EMG, EKG, and respiratory parameters were recorded.  Sleep stages, arousals, limb movements, and respiratory data were scored according to criteria laid out by the American Academy of Sleep Medicine.  MEDICATIONS:  SLEEP ARCHITECTURE:  Lights out was at 9:55 p.m., lights on was at 5:34 a.m.  Total sleep time was 395 minutes with a sleep period of time of 442 minutes and a sleep efficiency of 86%.  Sleep latency was 16 minutes, sleep latency to REM sleep was 113 minutes and wake after sleep onset was 47 minutes.  Sleep stages as the percentage of total sleep time was N1 8.5%, N2 68%, N3 6.7%, and REM sleep 17%.  Supine sleep accounted for 300 minutes and supine REM sleep accounted for 56 minutes. REM sleep was noted in multiple stages with the longest around 5 a.m.  AROUSAL DATA:  There were total of 131 arousals with an arousal index of 20 events per hour.  Most of these were spontaneous and the few were associated with respiratory events.  RESPIRATORY DATA:  There were total of 6 obstructive apneas, 1 central apnea, and 13 hypopneas  with an apnea-hypopnea index of 3 events per hour, 44 respiratory effort-related arousals (RERAs) were noted with an RDI of 9.7 events per hour.  The longest hypopnea was 46 seconds.  OXYGEN DATA:  The desaturation index was 2.7 events per hour and the lowest desaturation was 85%.  He spent 1.5 minutes with a saturation less than 88%.  CARDIAC DATA:  The low heart rate was 30 beats per minute.  The high heart rate recorded was an artifact.  No arrhythmias were noted.  MOVEMENT-PARASOMNIA:  No significant limb movements were noted.  DISCUSSION:  Normal sleep architecture.  Few respiratory events were noted, did not meet criteria for split intervention.  He was desensitized with a medium Quattro mask.  IMPRESSIONS-RECOMMENDATIONS: 1. Mild obstructive sleep apnea with predominant RERAs and loud     snoring causing some sleep fragmentation and mild desaturations.This is     consistent with upper airway resistance syndrome 2. No evidence of cardiac arrhythmias, limb movements, or behavioral     disturbance during sleep.  RECOMMENDATIONS: 1. The treatment options for this degree of sleep-disordered breathing     include weight loss, oral appliances, or CPAP therapy.  Depending     on patient preference, any combinations of the above may be tried.     If he is overly symptomatic, CPAP therapy would be indicated. 2. He should be cautioned against medications with sedative side     effects.  He should be advised against driving when sleepy.     Oretha Milch, MD    RVA/MEDQ  D:  03/07/2012 16:11:12  T:  03/07/2012 22:25:12  Job:  [id number]

## 2012-03-09 ENCOUNTER — Telehealth: Payer: Self-pay | Admitting: Family

## 2012-03-09 DIAGNOSIS — G4730 Sleep apnea, unspecified: Secondary | ICD-10-CM

## 2012-03-09 NOTE — Telephone Encounter (Signed)
Please call pt and let him know that sleep study shows mild sleep apnea.  I would like for him to meet with Dr. Vassie Loll to discuss treatment.

## 2012-03-10 ENCOUNTER — Telehealth: Payer: Self-pay | Admitting: Family

## 2012-03-10 NOTE — Telephone Encounter (Signed)
Patient is requesting sleep study results.

## 2012-03-10 NOTE — Telephone Encounter (Signed)
See phone note of 03/09/12.

## 2012-03-10 NOTE — Telephone Encounter (Signed)
Notified pt and he is agreeable to proceed with referral. 

## 2012-04-21 ENCOUNTER — Institutional Professional Consult (permissible substitution): Payer: 59 | Admitting: Pulmonary Disease

## 2012-04-22 ENCOUNTER — Ambulatory Visit (INDEPENDENT_AMBULATORY_CARE_PROVIDER_SITE_OTHER): Payer: 59 | Admitting: Pulmonary Disease

## 2012-04-22 ENCOUNTER — Encounter: Payer: Self-pay | Admitting: Pulmonary Disease

## 2012-04-22 VITALS — BP 100/68 | HR 80 | Temp 98.0°F | Ht 74.0 in | Wt 222.2 lb

## 2012-04-22 DIAGNOSIS — G478 Other sleep disorders: Secondary | ICD-10-CM

## 2012-04-22 NOTE — Assessment & Plan Note (Signed)
The pt has the upper airway resistance syndrome based on his sleep study, and is very symptomatic with a significant impact on his QOL and his work as a IT sales professional.   I have outlined the various treatment options, including a trial of weight loss alone, avoiding supine sleep, upper airway surgery, dental appliance, and finally CPAP.  His upper airway anatomy is not significantly abnormal, and therefore I do not believe he is a good surgical candidate.  The patient is interested in trying CPAP, but it must be covered by insurance in order for him to afford getting the device.  We will pre-certify his CPAP therapy to see if insurance will cover this.  I encouraged him to work aggressively on weight loss.

## 2012-04-22 NOTE — Progress Notes (Signed)
  Subjective:    Patient ID: Jeffrey Moses, male    DOB: 24-Feb-1966, 46 y.o.   MRN: [id number]  HPI The patient is a 46 year old male who I've been asked to see for an abnormal sleep study.  He recently underwent NPSG where he was found to have an AHI of only 3 events per hour, but an RDI of 10 events per hour.  This is consistent with the upper airway resistance syndrome.  The patient has been noted to have loud snoring, as well as an abnormal breathing pattern during sleep.  He also notes occasional choking arousals.  He has frequent awakenings at night, and has not rested in the mornings upon arising.  He notes significant sleep pressure during the day with any period of inactivity, and often takes an afternoon nap.  He falls asleep in the evenings easily while watching television.  He also has sleep pressure when driving longer distances.  The patient states that his weight is up about 15 pounds over the last 2 years, and his Epworth sleepiness score today is 17.  Sleep Questionnaire: What time do you typically go to bed?( Between what hours) 10pm-12am How long does it take you to fall asleep? 1-2 hrs How many times during the night do you wake up? What time do you get out of bed to start your day? 0700 Do you drive or operate heavy machinery in your occupation? Yes How much has your weight changed (up or down) over the past two years? (In pounds) 15 lb (6.804 kg) Have you ever had a sleep study before? Yes If yes, location of study? [facility] If yes, date of study? 02/20/12 Do you currently use CPAP? No Do you wear oxygen at any time? No    Review of Systems  Constitutional: Negative for fever and unexpected weight change.  HENT: Negative for ear pain, nosebleeds, congestion, sore throat, rhinorrhea, sneezing, trouble swallowing, dental problem, postnasal drip and sinus pressure.   Eyes: Negative for redness and itching.  Respiratory: Negative for cough, chest tightness, shortness of breath  and wheezing.   Cardiovascular: Negative for palpitations and leg swelling.  Gastrointestinal: Negative for nausea and vomiting.  Genitourinary: Negative for dysuria.  Musculoskeletal: Negative for joint swelling.  Skin: Negative for rash.  Neurological: Negative for headaches.  Hematological: Does not bruise/bleed easily.  Psychiatric/Behavioral: Negative for dysphoric mood. The patient is not nervous/anxious.   All other systems reviewed and are negative.       Objective:   Physical Exam Constitutional:  Well developed, no acute distress  HENT:  Nares patent without discharge, +septal deviation to the left.  Oropharynx without exudate, palate and uvula are mildly elongated.   Eyes:  Perrla, eomi, no scleral icterus  Neck:  No JVD, no TMG  Cardiovascular:  Normal rate, regular rhythm, no rubs or gallops.  No murmurs        Intact distal pulses  Pulmonary :  Normal breath sounds, no stridor or respiratory distress   No rales, rhonchi, or wheezing  Abdominal:  Soft, nondistended, bowel sounds present.  No tenderness noted.   Musculoskeletal:  No lower extremity edema noted.  Lymph Nodes:  No cervical lymphadenopathy noted  Skin:  No cyanosis noted  Neurologic:  Alert, appropriate, moves all 4 extremities without obvious deficit.         Assessment & Plan:

## 2012-04-22 NOTE — Patient Instructions (Addendum)
Will see if insurance will approve cpap for the upper airway resistance syndrome.  If so, will start you out at a moderate pressure to see how you respond.  Please call if having tolerance issues. Work on weight loss, and avoid sleeping on your back if you are NOT going to wear cpap. followup with me in 6 weeks if you are going to stay on cpap.

## 2012-10-13 ENCOUNTER — Telehealth: Payer: Self-pay | Admitting: Pulmonary Disease

## 2012-10-13 NOTE — Telephone Encounter (Signed)
I spoke with pt. He was just needing [facility] phone #. Nothing further was needed

## 2013-11-11 ENCOUNTER — Telehealth: Payer: Self-pay | Admitting: Family

## 2013-11-11 NOTE — Telephone Encounter (Addendum)
Pt saw you in [place] and would like to know if you would accept him back as a pt? Pt lives near our office and had to switch pcp when you went out.

## 2013-11-22 NOTE — Telephone Encounter (Signed)
I can but make sure he understands I have limited office hours due to personal health issues.

## 2015-05-01 ENCOUNTER — Telehealth: Payer: Self-pay | Admitting: Pulmonary Disease

## 2015-05-01 NOTE — Telephone Encounter (Signed)
Pt not seen since 04/22/12. Called spoke with pt. He reports his CPAP has currently broke. He took it to [facility] and was told it was not repairable. He needs order for new CPAP. I have placed him on VS schedule for wed at 3:45 as he had an opening. Nothing further needed

## 2015-05-03 ENCOUNTER — Institutional Professional Consult (permissible substitution): Payer: Self-pay | Admitting: Pulmonary Disease

## 2015-05-16 ENCOUNTER — Other Ambulatory Visit: Payer: Self-pay | Admitting: Physician Assistant

## 2015-05-30 ENCOUNTER — Encounter: Payer: Self-pay | Admitting: Family

## 2015-06-12 ENCOUNTER — Encounter: Payer: Self-pay | Admitting: Family

## 2015-06-12 ENCOUNTER — Ambulatory Visit (INDEPENDENT_AMBULATORY_CARE_PROVIDER_SITE_OTHER): Payer: Commercial Managed Care - HMO | Admitting: Family

## 2015-06-12 VITALS — BP 122/80 | HR 56 | Temp 97.9°F | Resp 16 | Ht 74.0 in | Wt 219.2 lb

## 2015-06-12 DIAGNOSIS — E785 Hyperlipidemia, unspecified: Secondary | ICD-10-CM

## 2015-06-12 DIAGNOSIS — Z23 Encounter for immunization: Secondary | ICD-10-CM

## 2015-06-12 DIAGNOSIS — Z0000 Encounter for general adult medical examination without abnormal findings: Secondary | ICD-10-CM | POA: Diagnosis not present

## 2015-06-12 LAB — URINALYSIS, ROUTINE W REFLEX MICROSCOPIC
Bilirubin Urine: NEGATIVE
Ketones, ur: NEGATIVE
Leukocytes, UA: NEGATIVE
Nitrite: NEGATIVE
RBC / HPF: NONE SEEN (ref 0–?)
Specific Gravity, Urine: <=1.005 — AB (ref 1.000–1.030)
Total Protein, Urine: NEGATIVE
URINE GLUCOSE: NEGATIVE
Urobilinogen, UA: 0.2 (ref 0.0–1.0)
WBC, UA: NONE SEEN (ref 0–?)
pH: 6 (ref 5.0–8.0)

## 2015-06-12 LAB — TSH: TSH: 2.68 u[IU]/mL (ref 0.35–4.50)

## 2015-06-12 NOTE — Patient Instructions (Signed)
Complete lab work prior to leaving. Please work on low fat/low cholesterol diet, exercise, weight loss. Schedule lab draw in 6 months so we can recheck your cholesterol.  Follow up in 1 year for annual physical.

## 2015-06-12 NOTE — Addendum Note (Signed)
Addended by: [name] on: 06/12/2015 11:32 AM   Modules accepted: Miquel Dunn

## 2015-06-12 NOTE — Assessment & Plan Note (Signed)
Pt brings labs from his employer which are reviewed and include cholesterol (253, LDL 161, HDL 50, trig 209) We discussed low fat/low cholesterol diet, exercise weight loss.

## 2015-06-12 NOTE — Assessment & Plan Note (Signed)
Obtain TSH, UA.  Tdap today, flu shot up to date.

## 2015-06-12 NOTE — Addendum Note (Signed)
Addended by: [name] on: 06/12/2015 11:44 AM   Modules accepted: Orders, SmartSet

## 2015-06-12 NOTE — Progress Notes (Addendum)
Subjective:    Patient ID: Jeffrey Moses, male    DOB: [birth year], 49 y.o.   MRN: [id number]  HPI  Patient presents today for complete physical.  Immunizations:  Due for tetanus shot Diet: healthy Exercise: runs/lifts weights  [facility]- reports that he is feeling well on cpap and has follow up with pulmonology.  Review of Systems  Constitutional:       Wt Readings from Last 3 Encounters: 06/12/15 : 219 lb 3.2 oz (99.428 kg) 04/22/12 : 222 lb 3.2 oz (100.789 kg) 02/20/12 : 215 lb (97.523 kg)    HENT: Negative for rhinorrhea.        Reports mild hearing loss, does not interfere with his day to day  Eyes: Negative for visual disturbance.  Respiratory: Negative for cough and shortness of breath.   Cardiovascular: Negative for leg swelling.  Gastrointestinal: Negative for diarrhea and constipation.  Genitourinary: Negative for dysuria and frequency.  Musculoskeletal: Negative for myalgias and arthralgias.  Skin: Negative for rash.  Neurological: Negative for headaches.  Hematological: Negative for adenopathy.  Psychiatric/Behavioral:       Denies depression/anxiety   Past Medical History  Diagnosis Date  . History of thyroid nodule 2006    benign  . Chewing tobacco use     history of chewing tobacco x 17 years  . Heart block, first degree     Social History   Social History  . Marital Status: Married    Spouse Name: N/A  . Number of Children: N/A  . Years of Education: N/A   Occupational History  . Not on file.   Social History Main Topics  . Smoking status: Never Smoker   . Smokeless tobacco: Former Systems developer    Types: Chew    Quit date: 09/10/1999  . Alcohol Use: Yes     Comment: seldom (1-2 per month)  . Drug Use: No  . Sexual Activity: Not on file   Other Topics Concern  . Not on file   Social History Narrative     Married      Three children 68, 70, 62 month old      Firefighter      Alcohol - seldom      Previously chewed tobacco x 17 yrs.  Quit  2001     Past Surgical History  Procedure Laterality Date  . Thyroidectomy, partial  2006    Family History  Problem Relation Age of Onset  . Diabetes    . Cancer Other     skin    No Known Allergies  No current outpatient prescriptions on file prior to visit.   No current facility-administered medications on file prior to visit.    BP 122/80 mmHg  Pulse 56  Temp(Src) 97.9 F (36.6 C) (Oral)  Resp 16  Ht 6\' 2"  (1.88 m)  Wt 219 lb 3.2 oz (99.428 kg)  BMI 28.13 kg/m2  SpO2 98%       Objective:   Physical Exam Physical Exam  Constitutional: He is oriented to person, place, and time. He appears well-developed and well-nourished. No distress.  HENT:  Head: Normocephalic and atraumatic.  Right Ear: Tympanic membrane and ear canal normal.  Left Ear: Tympanic membrane and ear canal normal.  Mouth/Throat: Oropharynx is clear and moist.  Eyes: Pupils are equal, round, and reactive to light. No scleral icterus.  Neck: Normal range of motion. No thyromegaly present.  Cardiovascular: Normal rate and regular rhythm.   No murmur heard. Pulmonary/Chest: Effort  normal and breath sounds normal. No respiratory distress. He has no wheezes. He has no rales. He exhibits no tenderness.  Abdominal: Soft. Bowel sounds are normal. He exhibits no distension and no mass. There is no tenderness. There is no rebound and no guarding.  Musculoskeletal: He exhibits no edema.  Lymphadenopathy:    He has no cervical adenopathy.  Neurological: He is alert and oriented to person, place, and time. He has normal patellar reflexes. He exhibits normal muscle tone. Coordination normal.  Skin: Skin is warm and dry.  Psychiatric: He has a normal mood and affect. His behavior is normal. Judgment and thought content normal.          Assessment & Plan:          Assessment & Plan:  EKG tracing is personally reviewed.  EKG notes NSR.  No acute changes.

## 2015-06-13 ENCOUNTER — Telehealth: Payer: Self-pay | Admitting: *Deleted

## 2015-06-13 DIAGNOSIS — R3129 Other microscopic hematuria: Secondary | ICD-10-CM

## 2015-06-13 NOTE — Telephone Encounter (Signed)
Notified pt and he voices understanding.  Lab appt scheduled for 06/28/15 at 9am.  Future order entered.

## 2015-06-13 NOTE — Telephone Encounter (Signed)
-----   Message from [name], NP sent at 06/12/2015  2:07 PM EDT ----- Please let pt know that there was a small amount of hemoglobin in his urine. I would like him to repeat UA with micro in 2 weeks please (dx hematuria) thyroid function is normal..

## 2015-06-28 ENCOUNTER — Other Ambulatory Visit: Payer: Self-pay

## 2015-06-28 DIAGNOSIS — R3129 Other microscopic hematuria: Secondary | ICD-10-CM

## 2015-06-29 ENCOUNTER — Encounter: Payer: Self-pay | Admitting: Family

## 2015-06-29 LAB — URINALYSIS, MICROSCOPIC ONLY
Bacteria, UA: NONE SEEN [HPF]
Casts: NONE SEEN [LPF]
Crystals: NONE SEEN [HPF]
RBC / HPF: NONE SEEN RBC/HPF (ref <=2)
Squamous Epithelial / HPF: NONE SEEN [HPF] (ref <=5)
WBC UA: NONE SEEN WBC/HPF (ref <=5)
Yeast: NONE SEEN [HPF]

## 2015-06-29 LAB — URINALYSIS, ROUTINE W REFLEX MICROSCOPIC
Bilirubin Urine: NEGATIVE
Glucose, UA: NEGATIVE
HGB URINE DIPSTICK: NEGATIVE
KETONES UR: NEGATIVE
Leukocytes, UA: NEGATIVE
Nitrite: NEGATIVE
PH: 6.5 (ref 5.0–8.0)
Protein, ur: NEGATIVE
SPECIFIC GRAVITY, URINE: 1.016 (ref 1.001–1.035)

## 2015-07-03 ENCOUNTER — Ambulatory Visit (INDEPENDENT_AMBULATORY_CARE_PROVIDER_SITE_OTHER): Payer: Commercial Managed Care - HMO | Admitting: Pulmonary Disease

## 2015-07-03 ENCOUNTER — Encounter: Payer: Self-pay | Admitting: Pulmonary Disease

## 2015-07-03 VITALS — BP 140/82 | HR 65 | Temp 97.5°F | Ht 74.0 in | Wt 224.0 lb

## 2015-07-03 DIAGNOSIS — G478 Other sleep disorders: Secondary | ICD-10-CM

## 2015-07-03 DIAGNOSIS — G4733 Obstructive sleep apnea (adult) (pediatric): Secondary | ICD-10-CM

## 2015-07-03 NOTE — Patient Instructions (Signed)
Will arrange for new CPAP machine Follow up in 1 year 

## 2015-07-03 NOTE — Progress Notes (Signed)
Chief Complaint  Patient presents with  . SLEEP CONSULT    pt last seen [facility] in 2013.  pt states he needs an order for a new CPAP. pt was using CPAP evrynight for about 7 hours a night. no conerns Epworth Score: 12  DME: [facility].    History of Present Illness: Jeffrey Moses is a 49 y.o. male for evaluation of sleep problems.  He was seen in 2013 by Dr. Gwenette Greet.  He was found to have obstructive sleep apnea based on RDI and upper airway resistance syndrome.  He was set up with CPAP.  His snoring, and sleep were much improved.  His daytime alertness also improved with CPAP.  His machine broke last Spring, and could not be repaired.  He has been using a loaner device, and was told he need prescription to get new machine.    His sleep time varies based on his work schedule.  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  He gets about 7 to 8 hours sleep on days he is not working.  He denies morning headache.  He does not use anything to help him fall sleep or stay awake.  He denies sleep walking, sleep talking, bruxism, or nightmares.  There is no history of restless legs.  He denies sleep hallucinations, sleep paralysis, or cataplexy.  The Epworth score is 12 out of 24.  Tests: PSG 02/21/12 >> AHI 3, RDI 10  Jeffrey Moses  has a past medical history of History of thyroid nodule (2006); Chewing tobacco use; and Heart block, first degree.  Jeffrey Moses  has past surgical history that includes Thyroidectomy, partial (2006).  Prior to Admission medications   Not on File    No Known Allergies  His family history includes Cancer in his other; Diabetes in an other family member.  He  reports that he has never smoked. He quit smokeless tobacco use about 15 years ago. His smokeless tobacco use included Chew. He reports that he drinks alcohol. He reports that he does not use illicit drugs.  Review of Systems  Constitutional: Negative for fever and  unexpected weight change.  HENT: Negative for congestion, dental problem, ear pain, nosebleeds, postnasal drip, rhinorrhea, sinus pressure, sneezing, sore throat and trouble swallowing.   Eyes: Negative for redness and itching.  Respiratory: Negative for cough, chest tightness, shortness of breath and wheezing.   Cardiovascular: Negative for palpitations and leg swelling.  Gastrointestinal: Negative for nausea and vomiting.  Genitourinary: Negative for dysuria.  Musculoskeletal: Negative for joint swelling.  Skin: Negative for rash.  Neurological: Negative for headaches.  Hematological: Does not bruise/bleed easily.  Psychiatric/Behavioral: Negative for dysphoric mood. The patient is not nervous/anxious.    Physical Exam: BP 140/82 mmHg  Pulse 65  Temp(Src) 97.5 F (36.4 C) (Oral)  Ht 6\' 2"  (1.88 m)  Wt 224 lb (101.606 kg)  BMI 28.75 kg/m2  SpO2 96%  General - No distress ENT - No sinus tenderness, no oral exudate, no LAN, no thyromegaly, TM clear, pupils equal/reactive, narrow nasal angle, MP 4, high arched palate Cardiac - s1s2 regular, no murmur, pulses symmetric Chest - No wheeze/rales/dullness, good air entry, normal respiratory excursion Back - No focal tenderness Abd - Soft, non-tender, no organomegaly, + bowel sounds Ext - No edema Neuro - Normal strength, cranial nerves intact Skin - No rashes Psych - Normal mood, and behavior  Discussion: He has hx of obstructive  sleep apnea based on his RDI and upper airway resistance syndrome.  He has improvement with CPAP therapy.  His machine has broke and can't be repaired.    Assessment/plan:  Obstructive sleep apnea with upper airway resistance syndrome. Plan: - will arrange for new auto CPAP - will get download from his new machine - explained that he might need repeat sleep study for insurance coverage of new device   Chesley Mires, M.D. Pager [phone]

## 2015-07-03 NOTE — Progress Notes (Deleted)
   Subjective:    Patient ID: Jeffrey Moses, male    DOB: 03-28-1966, 49 y.o.   MRN: [id number]  HPI    Review of Systems  Constitutional: Negative for fever and unexpected weight change.  HENT: Negative for congestion, dental problem, ear pain, nosebleeds, postnasal drip, rhinorrhea, sinus pressure, sneezing, sore throat and trouble swallowing.   Eyes: Negative for redness and itching.  Respiratory: Negative for cough, chest tightness, shortness of breath and wheezing.   Cardiovascular: Negative for palpitations and leg swelling.  Gastrointestinal: Negative for nausea and vomiting.  Genitourinary: Negative for dysuria.  Musculoskeletal: Negative for joint swelling.  Skin: Negative for rash.  Neurological: Negative for headaches.  Hematological: Does not bruise/bleed easily.  Psychiatric/Behavioral: Negative for dysphoric mood. The patient is not nervous/anxious.        Objective:   Physical Exam        Assessment & Plan:

## 2015-12-12 ENCOUNTER — Other Ambulatory Visit: Payer: Commercial Managed Care - HMO

## 2016-01-30 ENCOUNTER — Telehealth: Payer: Self-pay | Admitting: Pulmonary Disease

## 2016-01-30 NOTE — Telephone Encounter (Signed)
Spoke with Caryl Pina @ [facility] and she states that [facility] requires pt to come in for follow up within 4-6 weeks after starting new CPAP machine. Not even a new start, just a new machine. Riccardo Dubin that we were not aware of this requirement. She states that pt needs to be seen ASAP to continue CPAP approval.  Spoke with pt and advised of situation. He states that he was unaware he needed follow up any sooner than the 1 year that was recommended at [facility]. Pt agreed to come in and was scheduled for 02/07/16 @ 9am with TP as this was the earliest date he could come in due to his work schedule.  Nothing further needed.

## 2016-02-07 ENCOUNTER — Ambulatory Visit (INDEPENDENT_AMBULATORY_CARE_PROVIDER_SITE_OTHER): Payer: Commercial Managed Care - HMO | Admitting: Adult Health

## 2016-02-07 ENCOUNTER — Encounter: Payer: Self-pay | Admitting: Adult Health

## 2016-02-07 VITALS — BP 132/86 | HR 86 | Ht 74.0 in | Wt 225.0 lb

## 2016-02-07 DIAGNOSIS — G478 Other sleep disorders: Secondary | ICD-10-CM

## 2016-02-07 NOTE — Addendum Note (Signed)
Addended by: Osa Craver on: 02/07/2016 09:37 AM   Modules accepted: Orders

## 2016-02-07 NOTE — Patient Instructions (Signed)
Continue CPAP At bedtime .  Work on weight loss  Do not drive if sleepy.  follow up in 1 year with Dr. Halford Chessman

## 2016-02-07 NOTE — Progress Notes (Signed)
Subjective:    Patient ID: Jeffrey Moses, male    DOB: [birth year], 50 y.o.   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   Tests: PSG 02/21/12 >> AHI 3, RDI 10  02/07/2016  Follow up :  Patient returns for a follow-up visit for obstructive sleep apnea with upper airway resistant syndrome. Patient says he is doing very well on C Pap. Feels rested. Says he loves his CPAP .  Uses CPAP on average 6-8 hours nightly, nasal pillows work good. Needs supplies.  Download shows excellent compliance with average usage at 6.5 hours. He is on AutoSet 5-15 cm of H2O. AHI 0.6. Minimum leaks. Denies chest pain, orthopnea, edema or fever.    Past Medical History  Diagnosis Date  . History of thyroid nodule 2006    benign  . Chewing tobacco use     history of chewing tobacco x 17 years  . Heart block, first degree    No current outpatient prescriptions on file prior to visit.   No current facility-administered medications on file prior to visit.     Review of Systems Constitutional:   No  weight loss, night sweats,  Fevers, chills, fatigue, or  lassitude.  HEENT:   No headaches,  Difficulty swallowing,  Tooth/dental problems, or  Sore throat,                No sneezing, itching, ear ache, nasal congestion, post nasal drip,   CV:  No chest pain,  Orthopnea, PND, swelling in lower extremities, anasarca, dizziness, palpitations, syncope.   GI  No heartburn, indigestion, abdominal pain, nausea, vomiting, diarrhea, change in bowel habits, loss of appetite, bloody stools.   Resp: No shortness of breath with exertion or at rest.  No excess mucus, no productive cough,  No non-productive cough,  No coughing up of blood.  No change in color of mucus.  No wheezing.  No chest wall deformity  Skin: no rash or lesions.  GU: no dysuria, change in color of urine, no urgency or frequency.  No flank  pain, no hematuria   MS:  No joint pain or swelling.  No decreased range of motion.  No back pain.  Psych:  No change in mood or affect. No depression or anxiety.  No memory loss.       Objective:   Physical Exam  Filed Vitals:   02/07/16 0909  BP: 132/86  Pulse: 86  Height: 6\' 2"  (1.88 m)  Weight: 225 lb (102.059 kg)  SpO2: 98%  Body mass index is 28.88 kg/(m^2).   GEN: A/Ox3; pleasant , NAD, well nourished   HEENT:  [place]/AT,  EACs-clear, TMs-wnl, NOSE-clear, THROAT-clear, no lesions, no postnasal drip or exudate noted. Class 2 MP airway   NECK:  Supple w/ fair ROM; no JVD; normal carotid impulses w/o bruits; no thyromegaly or nodules palpated; no lymphadenopathy.  RESP  Clear  P & A; w/o, wheezes/ rales/ or rhonchi.no accessory muscle use, no dullness to percussion  CARD:  RRR, no m/r/g  , no peripheral edema, pulses intact, no cyanosis or clubbing.  GI:   Soft & nt; nml bowel sounds; no organomegaly or masses detected.  Musco: Warm bil, no deformities or joint swelling noted.   Neuro: alert, no focal deficits noted.    Skin: Warm, no lesions or rashes  Greydon Betke NP-C  [facility]  02/07/2016       Assessment & Plan:

## 2016-02-07 NOTE — Assessment & Plan Note (Signed)
Doing well on CPAP  Download shows excellent compliance.   Plan  Continue CPAP At bedtime .  Work on weight loss  Do not drive if sleepy.  follow up in 1 year with Dr. Halford Chessman

## 2016-02-08 ENCOUNTER — Other Ambulatory Visit: Payer: Self-pay | Admitting: Adult Health

## 2016-02-08 DIAGNOSIS — G478 Other sleep disorders: Secondary | ICD-10-CM

## 2016-02-08 NOTE — Progress Notes (Signed)
Reviewed and agree with assessment/plan.  Chesley Mires, MD [facility] 02/08/2016, 3:23 AM Pager:  [phone]

## 2016-02-16 ENCOUNTER — Encounter: Payer: Self-pay | Admitting: Adult Health

## 2017-01-30 DIAGNOSIS — G4733 Obstructive sleep apnea (adult) (pediatric): Secondary | ICD-10-CM | POA: Diagnosis not present

## 2017-02-04 ENCOUNTER — Encounter (HOSPITAL_COMMUNITY): Payer: Self-pay | Admitting: Family Medicine

## 2017-02-04 ENCOUNTER — Ambulatory Visit (HOSPITAL_COMMUNITY)
Admission: EM | Admit: 2017-02-04 | Discharge: 2017-02-04 | Disposition: A | Discharge status: Home / Self Care | Payer: Commercial Managed Care - HMO | Attending: Family Medicine | Admitting: Family Medicine

## 2017-02-04 DIAGNOSIS — R21 Rash and other nonspecific skin eruption: Secondary | ICD-10-CM | POA: Diagnosis not present

## 2017-02-04 DIAGNOSIS — L237 Allergic contact dermatitis due to plants, except food: Secondary | ICD-10-CM | POA: Diagnosis not present

## 2017-02-04 MED ORDER — METHYLPREDNISOLONE ACETATE 80 MG/ML IJ SUSP
80.0000 mg | Freq: Once | INTRAMUSCULAR | Status: AC
  Administered 2017-02-04: 80 mg via INTRAMUSCULAR

## 2017-02-04 MED ORDER — METHYLPREDNISOLONE ACETATE 80 MG/ML IJ SUSP
INTRAMUSCULAR | Status: AC
  Filled 2017-02-04: qty 1

## 2017-02-04 NOTE — ED Triage Notes (Signed)
Seen by dr lauenstein only 

## 2017-02-04 NOTE — ED Provider Notes (Signed)
Jeffrey Moses    CSN: [id number] Arrival date & time: 02/04/17  1008     History   Chief Complaint No chief complaint on file.   HPI Jeffrey Moses is a 51 y.o. male.   This a 51 year old male presents to the [facility] urgent care center complaining of rash that he thinks is poison ivy. He has gotten this every year or so for years and although the symptoms are fairly mild at this point and only involved his arms, he is going out of the country to [place] tomorrow. Patient works for the [employer] trade [employer].  Patient was recently doing some yard work and he feels that he was exposed over the weekend and only in the last 24 hours as he started to notice red streaks on his forearms.      Past Medical History:  Diagnosis Date  . Chewing tobacco use    history of chewing tobacco x 17 years  . Heart block, first degree   . History of thyroid nodule 2006   benign    Patient Active Problem List   Diagnosis Date Noted  . Preventative health care 06/12/2015  . UARS (upper airway resistance syndrome) 04/22/2012  . Hyperlipidemia 04/29/2011  . Epididymo-orchitis 04/16/2011    Past Surgical History:  Procedure Laterality Date  . THYROIDECTOMY, PARTIAL  2006       Home Medications    Prior to Admission medications   Not on File    Family History Family History  Problem Relation Age of Onset  . Diabetes Unknown   . Cancer Other        skin    Social History Social History  Substance Use Topics  . Smoking status: Never Smoker  . Smokeless tobacco: Former Systems developer    Types: Chew    Quit date: 09/10/1999  . Alcohol use 0.0 oz/week     Comment: seldom (1-2 per month)     Allergies   Patient has no known allergies.   Review of Systems Review of Systems  Skin: Positive for rash.  All other systems reviewed and are negative.    Physical Exam Triage Vital Signs ED Triage Vitals  Enc Vitals Group     BP      Pulse      Resp       Temp      Temp src      SpO2      Weight      Height      Head Circumference      Peak Flow      Pain Score      Pain Loc      Pain Edu?      Excl. in [place]?    No data found.   Updated Vital Signs There were no vitals taken for this visit.   Physical Exam  Constitutional: He is oriented to person, place, and time. He appears well-developed and well-nourished.  HENT:  Right Ear: External ear normal.  Left Ear: External ear normal.  Eyes: Conjunctivae are normal. Pupils are equal, round, and reactive to light.  Neck: Normal range of motion. Neck supple.  Pulmonary/Chest: Effort normal.  Musculoskeletal: Normal range of motion.  Neurological: He is alert and oriented to person, place, and time.  Skin: Skin is warm and dry. There is erythema.  A few erythematous streaks on forearms bilaterally  Nursing note and vitals reviewed.    UC Treatments / Results  Labs (all labs ordered are listed, but only abnormal results are displayed) Labs Reviewed - No data to display  EKG  EKG Interpretation None       Radiology No results found.  Procedures Procedures (including critical care time)  Medications Ordered in UC Medications  methylPREDNISolone acetate (DEPO-MEDROL) injection 80 mg (not administered)     Initial Impression / Assessment and Plan / UC Course  I have reviewed the triage vital signs and the nursing notes.  Pertinent labs & imaging results that were available during my care of the patient were reviewed by me and considered in my medical decision making (see chart for details).     Final Clinical Impressions(s) / UC Diagnoses   Final diagnoses:  Poison ivy dermatitis    New Prescriptions New Prescriptions   No medications on file     Robyn Haber, MD 02/04/17 1045

## 2017-09-08 DIAGNOSIS — M940 Chondrocostal junction syndrome [Tietze]: Secondary | ICD-10-CM | POA: Diagnosis not present

## 2017-09-08 DIAGNOSIS — I440 Atrioventricular block, first degree: Secondary | ICD-10-CM | POA: Diagnosis not present

## 2018-06-02 DIAGNOSIS — G4733 Obstructive sleep apnea (adult) (pediatric): Secondary | ICD-10-CM | POA: Diagnosis not present

## 2018-07-22 ENCOUNTER — Other Ambulatory Visit: Payer: Self-pay | Admitting: Emergency Medicine

## 2018-07-22 ENCOUNTER — Encounter: Payer: Self-pay | Admitting: Family

## 2018-07-22 ENCOUNTER — Ambulatory Visit (INDEPENDENT_AMBULATORY_CARE_PROVIDER_SITE_OTHER): Payer: 59 | Admitting: Family

## 2018-07-22 ENCOUNTER — Other Ambulatory Visit (INDEPENDENT_AMBULATORY_CARE_PROVIDER_SITE_OTHER): Payer: 59

## 2018-07-22 VITALS — BP 118/73 | HR 71 | Temp 97.8°F | Resp 16 | Ht 74.0 in | Wt 224.0 lb

## 2018-07-22 DIAGNOSIS — Z9009 Acquired absence of other part of head and neck: Secondary | ICD-10-CM

## 2018-07-22 DIAGNOSIS — Z0000 Encounter for general adult medical examination without abnormal findings: Secondary | ICD-10-CM

## 2018-07-22 DIAGNOSIS — Z23 Encounter for immunization: Secondary | ICD-10-CM

## 2018-07-22 DIAGNOSIS — Z125 Encounter for screening for malignant neoplasm of prostate: Secondary | ICD-10-CM | POA: Diagnosis not present

## 2018-07-22 DIAGNOSIS — E890 Postprocedural hypothyroidism: Secondary | ICD-10-CM

## 2018-07-22 LAB — HEPATIC FUNCTION PANEL
ALT: 21 U/L (ref 0–53)
AST: 22 U/L (ref 0–37)
Albumin: 4.7 g/dL (ref 3.5–5.2)
Alkaline Phosphatase: 46 U/L (ref 39–117)
BILIRUBIN DIRECT: 0.1 mg/dL (ref 0.0–0.3)
TOTAL PROTEIN: 7.7 g/dL (ref 6.0–8.3)
Total Bilirubin: 0.9 mg/dL (ref 0.2–1.2)

## 2018-07-22 LAB — BASIC METABOLIC PANEL
BUN: 11 mg/dL (ref 6–23)
CHLORIDE: 101 meq/L (ref 96–112)
CO2: 31 meq/L (ref 19–32)
CREATININE: 0.93 mg/dL (ref 0.40–1.50)
Calcium: 9.9 mg/dL (ref 8.4–10.5)
GFR: 91.35 mL/min (ref >60.00)
Glucose, Bld: 79 mg/dL (ref 70–99)
Potassium: 4.3 mEq/L (ref 3.5–5.1)
Sodium: 138 mEq/L (ref 135–145)

## 2018-07-22 LAB — URINALYSIS, ROUTINE W REFLEX MICROSCOPIC
Bilirubin Urine: NEGATIVE
HGB URINE DIPSTICK: NEGATIVE
Ketones, ur: NEGATIVE
LEUKOCYTES UA: NEGATIVE
NITRITE: NEGATIVE
RBC / HPF: NONE SEEN (ref 0–?)
Total Protein, Urine: NEGATIVE
Urine Glucose: NEGATIVE
Urobilinogen, UA: 0.2 (ref 0.0–1.0)
WBC UA: NONE SEEN (ref 0–?)
pH: 7.5 (ref 5.0–8.0)

## 2018-07-22 LAB — LIPID PANEL
CHOLESTEROL: 248 mg/dL — AB (ref 0–200)
HDL: 48.5 mg/dL (ref >39.00)
NonHDL: 199.47
TRIGLYCERIDES: 248 mg/dL — AB (ref 0.0–149.0)
Total CHOL/HDL Ratio: 5
VLDL: 49.6 mg/dL — ABNORMAL HIGH (ref 0.0–40.0)

## 2018-07-22 LAB — PSA: PSA: 1.84 ng/mL (ref 0.10–4.00)

## 2018-07-22 LAB — TSH: TSH: 2.13 u[IU]/mL (ref 0.35–4.50)

## 2018-07-22 LAB — LDL CHOLESTEROL, DIRECT: LDL DIRECT: 165 mg/dL

## 2018-07-22 NOTE — Addendum Note (Signed)
Addended by: Kelle Darting A on: 07/22/2018 12:48 PM   Modules accepted: Orders

## 2018-07-22 NOTE — Progress Notes (Signed)
Subjective:    Patient ID: Jeffrey Moses, male    DOB: 03/20/1966, 52 y.o.   MRN: [id number]  HPI  Patient presents today for complete physical.  Immunizations: flu shot and tetanus up to date, shingrix #1 today Diet:fair Wt Readings from Last 3 Encounters:  07/22/18 224 lb (101.6 kg)  02/07/16 225 lb (102.1 kg)  07/03/15 224 lb (101.6 kg)   Exercise:goes to the gym 3-4 times a week Tobacco abuse- Hx chewing tobacco quit 2001 Colonoscopy:  Due  Hx of partial thyroidectomy 2006.  OSA- followed by pulmonology.  Reports good compliance.    Review of Systems  Constitutional: Negative for unexpected weight change.  HENT: Negative for rhinorrhea.        Mild hearing loss  Eyes: Negative for visual disturbance.  Respiratory: Negative for cough and shortness of breath.   Cardiovascular: Negative for chest pain and leg swelling.  Genitourinary: Negative for difficulty urinating, dysuria and frequency.  Musculoskeletal: Negative for arthralgias and myalgias.  Skin: Negative for rash.  Neurological: Negative for headaches.  Hematological: Negative for adenopathy.  Psychiatric/Behavioral:       Denies depression/anxiety       Past Medical History:  Diagnosis Date  . Chewing tobacco use    history of chewing tobacco x 17 years  . Heart block, first degree   . History of thyroid nodule 2006   benign     Social History   Socioeconomic History  . Marital status: Married    Spouse name: Not on file  . Number of children: Not on file  . Years of education: Not on file  . Highest education level: Not on file  Occupational History  . Not on file  Social Needs  . Financial resource strain: Not on file  . Food insecurity:    Worry: Not on file    Inability: Not on file  . Transportation needs:    Medical: Not on file    Non-medical: Not on file  Tobacco Use  . Smoking status: Never Smoker  . Smokeless tobacco: Former Systems developer    Types: Chew  Substance and Sexual  Activity  . Alcohol use: Yes    Alcohol/week: 0.0 standard drinks    Comment: seldom (1-2 per month)  . Drug use: No  . Sexual activity: Not on file  Lifestyle  . Physical activity:    Days per week: Not on file    Minutes per session: Not on file  . Stress: Not on file  Relationships  . Social connections:    Talks on phone: Not on file    Gets together: Not on file    Attends religious service: Not on file    Active member of club or organization: Not on file    Attends meetings of clubs or organizations: Not on file    Relationship status: Not on file  . Intimate partner violence:    Fear of current or ex partner: Not on file    Emotionally abused: Not on file    Physically abused: Not on file    Forced sexual activity: Not on file  Other Topics Concern  . Not on file  Social History Narrative     Married      1998- daughter   11- daughter   49- son      Firefighter      Alcohol - seldom      Previously chewed tobacco x 17 yrs.  Quit 2001  Past Surgical History:  Procedure Laterality Date  . THYROIDECTOMY, PARTIAL  2006    Family History  Problem Relation Age of Onset  . Diabetes Unknown   . Cancer Other        skin    No Known Allergies  Current Outpatient Medications on File Prior to Visit  Medication Sig Dispense Refill  . Multiple Vitamin (MULTIVITAMIN) tablet Take 1 tablet by mouth daily.     No current facility-administered medications on file prior to visit.     BP 118/73 (BP Location: Right Arm, Patient Position: Sitting, Cuff Size: Large)   Pulse 71   Temp 97.8 F (36.6 C) (Oral)   Resp 16   Ht 6\' 2"  (1.88 m)   Wt 224 lb (101.6 kg)   SpO2 100%   BMI 28.76 kg/m    Objective:   Physical Exam  Physical Exam  Constitutional: He is oriented to person, place, and time. He appears well-developed and well-nourished. No distress.  HENT:  Head: Normocephalic and atraumatic.  Right Ear: Tympanic membrane and ear canal normal.  Left  Ear: Tympanic membrane and ear canal normal.  Mouth/Throat: Oropharynx is clear and moist.  Eyes: Pupils are equal, round, and reactive to light. No scleral icterus.  Neck: Normal range of motion. No thyromegaly present.  Cardiovascular: Normal rate and regular rhythm.   No murmur heard. Pulmonary/Chest: Effort normal and breath sounds normal. No respiratory distress. He has no wheezes. He has no rales. He exhibits no tenderness.  Abdominal: Soft. Bowel sounds are normal. He exhibits no distension and no mass. There is no tenderness. There is no rebound and no guarding.  Musculoskeletal: He exhibits no edema.  Lymphadenopathy:    He has no cervical adenopathy.  Neurological: He is alert and oriented to person, place, and time. He has normal patellar reflexes. He exhibits normal muscle tone. Coordination normal.  Skin: Skin is warm and dry.  Psychiatric: He has a normal mood and affect. His behavior is normal. Judgment and thought content normal.           Assessment & Plan:   Preventative care- discussed healthy diet, exercise, weight loss. Obtain routine lab work including PSA (discussed pros/cons.) Shingrix #1 today. Refer for colonoscopy.   Hx of thyroid nodule (partial thyroidectomy)- follicular adenoma 2778. Has not had formal follow up. I will refer to endocrinology for follow up and check a tsh.          Assessment & Plan:  EKG tracing is personally reviewed.  EKG notes NSR.  No acute changes.

## 2018-07-22 NOTE — Patient Instructions (Signed)
Please continue to work on healthy diet, exercise and weight loss. You should be contacted on your referral to gi for colonoscopy and endocrinology for follow up on your thyroid.

## 2018-07-28 ENCOUNTER — Telehealth: Payer: Self-pay | Admitting: Family

## 2018-07-28 NOTE — Telephone Encounter (Signed)
See my chart message

## 2018-09-22 ENCOUNTER — Ambulatory Visit (INDEPENDENT_AMBULATORY_CARE_PROVIDER_SITE_OTHER): Payer: 59

## 2018-09-22 ENCOUNTER — Encounter: Payer: Self-pay | Admitting: Family

## 2018-09-22 DIAGNOSIS — Z23 Encounter for immunization: Secondary | ICD-10-CM

## 2019-04-09 ENCOUNTER — Encounter: Payer: Self-pay | Admitting: Gastroenterology

## 2019-04-26 ENCOUNTER — Ambulatory Visit (AMBULATORY_SURGERY_CENTER): Payer: Self-pay | Admitting: *Deleted

## 2019-04-26 ENCOUNTER — Other Ambulatory Visit: Payer: Self-pay

## 2019-04-26 VITALS — Temp 97.0°F | Ht 74.0 in | Wt 203.2 lb

## 2019-04-26 DIAGNOSIS — Z1211 Encounter for screening for malignant neoplasm of colon: Secondary | ICD-10-CM

## 2019-04-26 MED ORDER — GOLYTELY 236 G PO SOLR: 4000.0000 mL | Freq: Once | ORAL | 0 refills | Status: AC

## 2019-04-26 NOTE — Progress Notes (Signed)
Patient denies any allergies to egg or soy products. Patient denies complications with anesthesia/sedation.  Patient denies oxygen use at home and denies diet medications. Pamphlet given on colonoscopy..  Emmi instructions also given to patient at [facility] appt.

## 2019-05-10 ENCOUNTER — Encounter: Payer: 59 | Admitting: Gastroenterology

## 2019-05-12 ENCOUNTER — Encounter: Payer: Self-pay | Admitting: Gastroenterology

## 2019-05-22 ENCOUNTER — Other Ambulatory Visit: Payer: Self-pay

## 2019-05-22 ENCOUNTER — Encounter: Payer: Self-pay | Admitting: Gastroenterology

## 2019-05-22 ENCOUNTER — Ambulatory Visit (AMBULATORY_SURGERY_CENTER): Payer: 59 | Admitting: Gastroenterology

## 2019-05-22 VITALS — BP 95/60 | HR 55 | Temp 97.6°F | Resp 14 | Ht 74.0 in | Wt 224.0 lb

## 2019-05-22 DIAGNOSIS — D128 Benign neoplasm of rectum: Secondary | ICD-10-CM

## 2019-05-22 DIAGNOSIS — Z1211 Encounter for screening for malignant neoplasm of colon: Secondary | ICD-10-CM

## 2019-05-22 MED ORDER — SODIUM CHLORIDE 0.9 % IV SOLN: 500.0000 mL | INTRAVENOUS | Status: DC

## 2019-05-22 NOTE — Patient Instructions (Signed)
YOU HAD AN ENDOSCOPIC PROCEDURE TODAY AT THE [facility]:   Refer to the procedure report that was given to you for any specific questions about what was found during the examination.  If the procedure report does not answer your questions, please call your gastroenterologist to clarify.  If you requested that your care partner not be given the details of your procedure findings, then the procedure report has been included in a sealed envelope for you to review at your convenience later.  YOU SHOULD EXPECT: Some feelings of bloating in the abdomen. Passage of more gas than usual.  Walking can help get rid of the air that was put into your GI tract during the procedure and reduce the bloating. If you had a lower endoscopy (such as a colonoscopy or flexible sigmoidoscopy) you may notice spotting of blood in your stool or on the toilet paper. If you underwent a bowel prep for your procedure, you may not have a normal bowel movement for a few days.  Please Note:  You might notice some irritation and congestion in your nose or some drainage.  This is from the oxygen used during your procedure.  There is no need for concern and it should clear up in a day or so.  SYMPTOMS TO REPORT IMMEDIATELY:   Following lower endoscopy (colonoscopy or flexible sigmoidoscopy):  Excessive amounts of blood in the stool  Significant tenderness or worsening of abdominal pains  Swelling of the abdomen that is new, acute  Fever of 100F or higher  For urgent or emergent issues, a gastroenterologist can be reached at any hour by calling [phone].   DIET:  We do recommend a small meal at first, but then you may proceed to your regular diet.  Drink plenty of fluids but you should avoid alcoholic beverages for 24 hours.  ACTIVITY:  You should plan to take it easy for the rest of today and you should NOT DRIVE or use heavy machinery until tomorrow (because of the sedation medicines used during the test).     FOLLOW UP: Our staff will call the number listed on your records 48-72 hours following your procedure to check on you and address any questions or concerns that you may have regarding the information given to you following your procedure. If we do not reach you, we will leave a message.  We will attempt to reach you two times.  During this call, we will ask if you have developed any symptoms of COVID 19. If you develop any symptoms (ie: fever, flu-like symptoms, shortness of breath, cough etc.) before then, please call [phone].  If you test positive for Covid 19 in the 2 weeks post procedure, please call and report this information to us.    If any biopsies were taken you will be contacted by phone or by letter within the next 1-3 weeks.  Please call us at [phone] if you have not heard about the biopsies in 3 weeks.    SIGNATURES/CONFIDENTIALITY: You and/or your care partner have signed paperwork which will be entered into your electronic medical record.  These signatures attest to the fact that that the information above on your After Visit Summary has been reviewed and is understood.  Full responsibility of the confidentiality of this discharge information lies with you and/or your care-partner. 

## 2019-05-22 NOTE — Progress Notes (Signed)
Called to room to assist during endoscopic procedure.  Patient ID and intended procedure confirmed with present staff. Received instructions for my participation in the procedure from the performing physician.  

## 2019-05-22 NOTE — Op Note (Signed)
[facility] Patient Name: Jeffrey Moses Procedure Date: 05/22/2019 10:25 AM MRN: [id number] Endoscopist: Milus Banister , MD Age: 53 Referring MD:  Date of Birth: [birth year] Gender: Male Account #: [id number] Procedure:                Colonoscopy Indications:              Screening for colorectal malignant neoplasm Medicines:                Monitored Anesthesia Care Procedure:                Pre-Anesthesia Assessment:                           - Prior to the procedure, a History and Physical                            was performed, and patient medications and                            allergies were reviewed. The patient's tolerance of                            previous anesthesia was also reviewed. The risks                            and benefits of the procedure and the sedation                            options and risks were discussed with the patient.                            All questions were answered, and informed consent                            was obtained. Prior Anticoagulants: The patient has                            taken no previous anticoagulant or antiplatelet                            agents. ASA Grade Assessment: II - A patient with                            mild systemic disease. After reviewing the risks                            and benefits, the patient was deemed in                            satisfactory condition to undergo the procedure.                           After obtaining informed consent, the colonoscope  was passed under direct vision. Throughout the                            procedure, the patient's blood pressure, pulse, and                            oxygen saturations were monitored continuously. The                            Colonoscope was introduced through the anus and                            advanced to the the cecum, identified by                            appendiceal orifice and  ileocecal valve. The                            colonoscopy was performed without difficulty. The                            patient tolerated the procedure well. The quality                            of the bowel preparation was good. The ileocecal                            valve, appendiceal orifice, and rectum were                            photographed. Scope In: 10:31:42 AM Scope Out: 10:43:54 AM Scope Withdrawal Time: 0 hours 8 minutes 36 seconds  Total Procedure Duration: 0 hours 12 minutes 12 seconds  Findings:                 A 4 mm polyp was found in the rectum. The polyp was                            pedunculated. The polyp was removed with a cold                            snare. Resection and retrieval were complete.                           The exam was otherwise without abnormality on                            direct and retroflexion views. Complications:            No immediate complications. Estimated blood loss:                            None. Estimated Blood Loss:     Estimated blood loss: none. Impression:               -  One 4 mm polyp in the rectum, removed with a cold                            snare. Resected and retrieved.                           - The examination was otherwise normal on direct                            and retroflexion views. Recommendation:           - Patient has a contact number available for                            emergencies. The signs and symptoms of potential                            delayed complications were discussed with the                            patient. Return to normal activities tomorrow.                            Written discharge instructions were provided to the                            patient.                           - Resume previous diet.                           - Continue present medications.                           - Repeat colonoscopy is recommended. The                             colonoscopy date will be determined after pathology                            results from today's exam become available for                            review. Likely repeat colonoscopy in 7-10 years. Milus Banister, MD 05/22/2019 10:46:01 AM This report has been signed electronically.

## 2019-05-22 NOTE — Progress Notes (Signed)
Report given to PACU, vss 

## 2019-05-22 NOTE — Progress Notes (Signed)
Jeffrey Moses check in, [name] VS

## 2019-05-22 NOTE — Progress Notes (Signed)
Pt's states no medical or surgical changes since previsit or office visit. 

## 2019-05-25 ENCOUNTER — Telehealth: Payer: Self-pay

## 2019-05-25 NOTE — Telephone Encounter (Signed)
Call to pt for f/u, mailbox full.

## 2019-05-25 NOTE — Telephone Encounter (Signed)
Call to pt for second follow up call, vm is full.

## 2019-05-28 ENCOUNTER — Encounter: Payer: Self-pay | Admitting: Gastroenterology

## 2020-09-21 ENCOUNTER — Other Ambulatory Visit: Payer: Self-pay | Admitting: Orthopedic Surgery

## 2020-09-21 DIAGNOSIS — M25562 Pain in left knee: Secondary | ICD-10-CM

## 2020-09-21 DIAGNOSIS — R609 Edema, unspecified: Secondary | ICD-10-CM

## 2020-09-29 ENCOUNTER — Ambulatory Visit
Admission: RE | Admit: 2020-09-29 | Discharge: 2020-09-29 | Disposition: A | Discharge status: Home / Self Care | Payer: 59 | Source: Ambulatory Visit | Attending: Orthopedic Surgery | Admitting: Orthopedic Surgery

## 2020-09-29 ENCOUNTER — Other Ambulatory Visit: Payer: Self-pay

## 2020-09-29 DIAGNOSIS — R609 Edema, unspecified: Secondary | ICD-10-CM

## 2020-09-29 DIAGNOSIS — M25562 Pain in left knee: Secondary | ICD-10-CM

## 2021-07-17 ENCOUNTER — Other Ambulatory Visit: Payer: Self-pay

## 2021-07-17 ENCOUNTER — Ambulatory Visit: Payer: 59 | Attending: Internal Medicine

## 2021-07-17 ENCOUNTER — Ambulatory Visit (INDEPENDENT_AMBULATORY_CARE_PROVIDER_SITE_OTHER): Payer: 59 | Admitting: Family

## 2021-07-17 ENCOUNTER — Encounter: Payer: Self-pay | Admitting: Family

## 2021-07-17 VITALS — BP 128/75 | HR 65 | Temp 98.0°F | Resp 16 | Ht 74.0 in | Wt 235.0 lb

## 2021-07-17 DIAGNOSIS — E785 Hyperlipidemia, unspecified: Secondary | ICD-10-CM

## 2021-07-17 DIAGNOSIS — Z125 Encounter for screening for malignant neoplasm of prostate: Secondary | ICD-10-CM

## 2021-07-17 DIAGNOSIS — R635 Abnormal weight gain: Secondary | ICD-10-CM | POA: Diagnosis not present

## 2021-07-17 DIAGNOSIS — Z23 Encounter for immunization: Secondary | ICD-10-CM

## 2021-07-17 DIAGNOSIS — Z0000 Encounter for general adult medical examination without abnormal findings: Secondary | ICD-10-CM

## 2021-07-17 LAB — LIPID PANEL
Cholesterol: 268 mg/dL — ABNORMAL HIGH (ref 0–200)
HDL: 46.2 mg/dL (ref >39.00)
NonHDL: 221.31
Total CHOL/HDL Ratio: 6
Triglycerides: 300 mg/dL — ABNORMAL HIGH (ref 0.0–149.0)
VLDL: 60 mg/dL — ABNORMAL HIGH (ref 0.0–40.0)

## 2021-07-17 LAB — COMPREHENSIVE METABOLIC PANEL
ALT: 22 U/L (ref 0–53)
AST: 25 U/L (ref 0–37)
Albumin: 4.4 g/dL (ref 3.5–5.2)
Alkaline Phosphatase: 43 U/L (ref 39–117)
BUN: 14 mg/dL (ref 6–23)
CO2: 29 mEq/L (ref 19–32)
Calcium: 9.5 mg/dL (ref 8.4–10.5)
Chloride: 102 mEq/L (ref 96–112)
Creatinine, Ser: 1.06 mg/dL (ref 0.40–1.50)
GFR: 80.33 mL/min (ref >60.00)
Glucose, Bld: 88 mg/dL (ref 70–99)
Potassium: 4.4 mEq/L (ref 3.5–5.1)
Sodium: 138 mEq/L (ref 135–145)
Total Bilirubin: 0.7 mg/dL (ref 0.2–1.2)
Total Protein: 7.2 g/dL (ref 6.0–8.3)

## 2021-07-17 LAB — PSA: PSA: 1.46 ng/mL (ref 0.10–4.00)

## 2021-07-17 LAB — LDL CHOLESTEROL, DIRECT: Direct LDL: 176 mg/dL

## 2021-07-17 LAB — TSH: TSH: 1.86 u[IU]/mL (ref 0.35–5.50)

## 2021-07-17 NOTE — Progress Notes (Signed)
   Covid-19 Vaccination Clinic  Name:  Jeffrey Moses    MRN: [id number] DOB: [birth year]  07/17/2021  [name] was observed post Covid-19 immunization for 15 minutes without incident. He was provided with Vaccine Information Sheet and instruction to access the V-Safe system.   [name] was instructed to call 911 with any severe reactions post vaccine: Difficulty breathing  Swelling of face and throat  A fast heartbeat  A bad rash all over body  Dizziness and weakness   Immunizations Administered     Name Date Dose VIS Date Route   Moderna Covid-19 vaccine Bivalent Booster 07/17/2021  2:53 PM 0.5 mL 04/21/2021 Intramuscular   Manufacturer: Moderna   Lot: [id number]   [place]: [id number]

## 2021-07-17 NOTE — Patient Instructions (Addendum)
Please schedule routine eye exam.   Complete lab work prior to leaving.

## 2021-07-17 NOTE — Assessment & Plan Note (Addendum)
Wt Readings from Last 3 Encounters:  07/17/21 235 lb (106.6 kg)  05/22/19 224 lb (101.6 kg)  04/26/19 203 lb 3.2 oz (92.2 kg)   Discussed healthy diet, exercise and weight loss. Immunizations reviewed and up to date.  Colo up to date. Check PSA.

## 2021-07-17 NOTE — Progress Notes (Signed)
Subjective:   By signing my name below, I, Jeffrey Moses, attest that this documentation has been prepared under the direction and in the presence of Jeffrey Alar, NP, 07/17/2021   Patient ID: Jeffrey Moses, male    DOB: 1966-03-09, 55 y.o.   MRN: [id number]  Chief Complaint  Patient presents with   Annual Exam         HPI Patient is in today for a comprehensive physical exam.  He denies any fever, unexpected weight change, adenopathy, rash, hearing loss, ear pain, rhinorrhea, visual disturbances, eye pain, chest pain, leg swelling, cough, nausea, vomitting, diarrhea, blood in stool, dysuria, frequency, myalgias, arthralgias, headaches, depression or anxiety.  Immunizations: His tetanus is UTD. He has had both Shingrix vaccines. He has already received his flu shot for this season. He has had 3 Covid-19 vaccines as of this visit and is interested in getting the updated booster today after his visit.  Diet: He mentions he is not maintaining a healthy diet but also does not eat very poorly.  Exercise: He notes he is not getting regular exercise at this time.  Colonoscopy: Last performed on 05/22/2019 and results found one 4 mm polyp in the rectum that was pedunculated. Recommended to repeat in 7-10 years.  PSA: Last measure on 07/22/2018 and was recorded at 1.84.  Vision: He is UTD on vision care.  Dental: He is UTD on dental care. Alcohol: He notes rare alcohol consumption.  Drugs: He does not use any drugs.  Tobacco: He is not currently using any tobacco products. He is a former chewing tobacco user.  SHx: He reports a surgery on his left knee to repair his torn meniscus.  FMHx: He denies any changes to his family medical history. He notes that both of his parents are living as well as his three older sisters. None of them take medications or have any chronic conditions. He has 3 children and all of them are healthy.    Health Maintenance Due  Topic Date Due   HIV  Screening  Never done   Hepatitis C Screening  Never done    Past Medical History:  Diagnosis Date   Chewing tobacco use    history of chewing tobacco x 17 years   Heart block, first degree    patient denies this dx- patient states "no heart problems"   History of thyroid nodule 2006   benign   Sleep apnea    mild, uses CPAP nightly    Past Surgical History:  Procedure Laterality Date   MENISCUS REPAIR Left    2022   THYROIDECTOMY, PARTIAL  2006   TONSILLECTOMY      Family History  Problem Relation Age of Onset   Diabetes Other    Cancer Other        skin   Colon polyps Neg Hx    Rectal cancer Neg Hx    Stomach cancer Neg Hx    Crohn's disease Neg Hx     Social History   Socioeconomic History   Marital status: Married    Spouse name: Not on file   Number of children: Not on file   Years of education: Not on file   Highest education level: Not on file  Occupational History   Not on file  Tobacco Use   Smoking status: Never   Smokeless tobacco: Former    Types: Chew    Quit date: 09/10/1999  Vaping Use   Vaping Use: Never used  Substance and Sexual Activity   Alcohol use: Yes    Alcohol/week: 0.0 standard drinks    Comment: seldom (1-2 per month)    Drug use: No   Sexual activity: Yes    Partners: Female  Other Topics Concern   Not on file  Social History Narrative     Married      1998- daughter   47- 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  Quit 2001    Social Determinants of Health   Financial Resource Strain: Not on file  Food Insecurity: Not on file  Transportation Needs: Not on file  Physical Activity: Not on file  Stress: Not on file  Social Connections: Not on file  Intimate Partner Violence: Not on file    Outpatient Medications Prior to Visit  Medication Sig Dispense Refill   meloxicam (MOBIC) 15 MG tablet TAKE 1 TABLET BY MOUTH EVERY DAY FOR 2 WEEKS, THEN ONLY TAKE  ONCE daily AS NEEDED thereafter     Multiple Vitamin (MULTIVITAMIN) tablet Take 1 tablet by mouth daily.     No facility-administered medications prior to visit.    No Known Allergies  Review of Systems  Constitutional:  Negative for fever.       (-) unexpected weight changes (-) adenopathy  HENT:  Negative for ear pain and hearing loss.        (-) rhinorrhea  Eyes:  Negative for pain.       (-) visual disturbances  Respiratory:  Negative for cough.   Cardiovascular:  Negative for chest pain and leg swelling.  Gastrointestinal:  Negative for blood in stool, diarrhea, nausea and vomiting.  Genitourinary:  Negative for dysuria and frequency.  Musculoskeletal:  Negative for joint pain and myalgias.  Skin:  Negative for rash.  Neurological:  Negative for headaches.  Psychiatric/Behavioral:  Negative for depression. The patient is not nervous/anxious.       Objective:    Physical Exam Constitutional:      General: He is not in acute distress.    Appearance: Normal appearance. He is not ill-appearing.  HENT:     Head: Normocephalic and atraumatic.     Right Ear: Tympanic membrane, ear canal and external ear normal.     Left Ear: Tympanic membrane, ear canal and external ear normal.  Eyes:     Extraocular Movements: Extraocular movements intact.     Pupils: Pupils are equal, round, and reactive to light.     Comments: (-) nystagmus  Cardiovascular:     Rate and Rhythm: Normal rate and regular rhythm.     Heart sounds: Normal heart sounds. No murmur heard.   No gallop.  Pulmonary:     Effort: Pulmonary effort is normal. No respiratory distress.     Breath sounds: Normal breath sounds. No wheezing or rales.  Abdominal:     General: Bowel sounds are normal.     Palpations: Abdomen is soft.     Tenderness: There is no abdominal tenderness.  Musculoskeletal:     Comments: (+) 5/5 upper and lower extremity strength  Lymphadenopathy:     Cervical: No cervical adenopathy.   Skin:    General: Skin is warm and dry.  Neurological:     Mental Status: He is alert and oriented to person, place, and time.     Deep Tendon Reflexes:  Reflex Scores:      Patellar reflexes are 1+ on the right side and 1+ on the left side. Psychiatric:        Behavior: Behavior normal.        Judgment: Judgment normal.    BP 128/75 (BP Location: Right Arm, Patient Position: Sitting, Cuff Size: Small)   Pulse 65   Temp 98 F (36.7 C) (Oral)   Resp 16   Ht '6\' 2"'  (1.88 m)   Wt 235 lb (106.6 kg)   SpO2 97%   BMI 30.17 kg/m  Wt Readings from Last 3 Encounters:  07/17/21 235 lb (106.6 kg)  05/22/19 224 lb (101.6 kg)  04/26/19 203 lb 3.2 oz (92.2 kg)       Assessment & Plan:   Problem List Items Addressed This Visit       Unprioritized   Preventative health care - Primary    Wt Readings from Last 3 Encounters:  07/17/21 235 lb (106.6 kg)  05/22/19 224 lb (101.6 kg)  04/26/19 203 lb 3.2 oz (92.2 kg)  Discussed healthy diet, exercise and weight loss. Immunizations reviewed and up to date.  Colo up to date. Check PSA.       Hyperlipidemia   Relevant Orders   Comp Met (CMET)   Lipid panel   Other Visit Diagnoses     Screening for prostate cancer       Relevant Orders   PSA   Weight gain       Relevant Orders   TSH      No orders of the defined types were placed in this encounter.   I, Jeffrey Alar, NP, personally preformed the services described in this documentation.  All medical record entries made by the scribe were at my direction and in my presence.  I have reviewed the chart and discharge instructions (if applicable) and agree that the record reflects my personal performance and is accurate and complete. 07/17/2021  I,Jeffrey Moses,acting as a Education administrator for Nance Pear, NP.,have documented all relevant documentation on the behalf of Nance Pear, NP,as directed by  Nance Pear, NP while in the presence of Nance Pear, NP.  Nance Pear, NP

## 2021-07-18 ENCOUNTER — Telehealth: Payer: Self-pay | Admitting: Family

## 2021-07-18 DIAGNOSIS — E785 Hyperlipidemia, unspecified: Secondary | ICD-10-CM

## 2021-07-18 NOTE — Telephone Encounter (Signed)
See mychart.  

## 2021-07-23 ENCOUNTER — Encounter: Payer: Self-pay | Admitting: Pulmonary Disease

## 2021-07-23 ENCOUNTER — Ambulatory Visit (INDEPENDENT_AMBULATORY_CARE_PROVIDER_SITE_OTHER): Payer: 59 | Admitting: Pulmonary Disease

## 2021-07-23 ENCOUNTER — Other Ambulatory Visit: Payer: Self-pay

## 2021-07-23 VITALS — BP 124/80 | Ht 74.0 in | Wt 235.2 lb

## 2021-07-23 DIAGNOSIS — G4733 Obstructive sleep apnea (adult) (pediatric): Secondary | ICD-10-CM

## 2021-07-23 DIAGNOSIS — Z9989 Dependence on other enabling machines and devices: Secondary | ICD-10-CM

## 2021-07-23 NOTE — Addendum Note (Signed)
Addended by: Vanetta Shawl on: 07/23/2021 03:50 PM   Modules accepted: Orders

## 2021-07-23 NOTE — Patient Instructions (Signed)
We will contact [facility] for CPAP supplies  Will see you on a yearly basis   Call with significant concerns  Continue using CPAP nightly

## 2021-07-23 NOTE — Progress Notes (Signed)
Jeffrey Moses    [id number]    1966-09-07  Primary Care Physician:[name], Jeffrey Sciara, NP  Referring Physician: [name], NP [address] [name],  [name] [phone]  Chief complaint:   Patient is being seen for obstructive sleep apnea  HPI:  Diagnosed with upper airway resistance syndrome in the past Has been on CPAP Current machine is about 53 years old and seems to be working well  Uses CPAP on a nightly basis Wakes up feeling restored Denies any significant daytime sleepiness  Weight has been relatively stable but may be about 20 pounds up in the last couple years Usually goes to bed about 10:50 PM Takes him about 30 minutes to fall asleep 1-2 awakenings Final wake up time between 545 and 6:45 AM  Denies any significant dryness of his mouth in the mornings No morning headaches No significant health concerns  Dad does have obstructive sleep apnea   No outpatient encounter medications on file as of 07/23/2021.   No facility-administered encounter medications on file as of 07/23/2021.    Allergies as of 07/23/2021   (No Known Allergies)    Past Medical History:  Diagnosis Date   Chewing tobacco use    history of chewing tobacco x 17 years   Heart block, first degree    patient denies this dx- patient states "no heart problems"   History of thyroid nodule 2006   benign   Sleep apnea    mild, uses CPAP nightly    Past Surgical History:  Procedure Laterality Date   MENISCUS REPAIR Left    2022   THYROIDECTOMY, PARTIAL  2006   TONSILLECTOMY      Family History  Problem Relation Age of Onset   Diabetes Other    Cancer Other        skin   Colon polyps Neg Hx    Rectal cancer Neg Hx    Stomach cancer Neg Hx    Crohn's disease Neg Hx     Social History   Socioeconomic History   Marital status: Married    Spouse name: Not on file   Number of children: Not on file   Years of education: Not on file    Highest education level: Not on file  Occupational History   Not on file  Tobacco Use   Smoking status: Never   Smokeless tobacco: Former    Types: Chew    Quit date: 09/10/1999  Vaping Use   Vaping Use: Never used  Substance and Sexual Activity   Alcohol use: Yes    Alcohol/week: 0.0 standard drinks    Comment: seldom (1-2 per month)    Drug use: No   Sexual activity: Yes    Partners: Female  Other Topics Concern   Not on file  Social History Narrative     Married      1998- daughter   60- 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  Quit 2001    Social Determinants of Health   Financial Resource Strain: Not on file  Food Insecurity: Not on file  Transportation Needs: Not on file  Physical Activity: Not on file  Stress: Not on file  Social Connections: Not on file  Intimate Partner Violence: Not on file    Review of Systems  Constitutional:  Negative for fatigue.  Respiratory:  Negative for shortness of breath.    Vitals:   07/23/21 1130  BP: 124/80     Physical Exam Constitutional:      Appearance: He is obese.  HENT:     Head: Normocephalic.     Mouth/Throat:     Mouth: Mucous membranes are moist.     Comments: Mallampati 4, crowded oropharynx Eyes:     Pupils: Pupils are equal, round, and reactive to light.  Cardiovascular:     Rate and Rhythm: Normal rate.     Heart sounds: No murmur heard.   No friction rub.  Pulmonary:     Effort: No respiratory distress.     Breath sounds: No stridor. No wheezing or rhonchi.  Musculoskeletal:     Cervical back: No rigidity or tenderness.  Neurological:     Mental Status: He is alert.     Motor: No weakness.  Psychiatric:        Mood and Affect: Mood normal.   No flowsheet data found. Epworth score of 5   Data Reviewed: previous sleep study from 2013 reviewed showing upper airway resistance  CPAP compliance reveals 93% compliance, 7 hours 54  minutes Set between 5 and 15 Residual AHI of 0.5, 95 percentile pressure of 9.1  Assessment:  History of obstructive sleep apnea  Uses CPAP on a nightly basis  Benefiting from CPAP therapy  Describes benefit from using CPAP, no significant daytime sleepiness Feels restored on waking up in the morning  Plan/Recommendations: Continue CPAP  Contact D[facility] for CPAP supplies  Encouraged him to follow-up on a yearly basis  Call us with any significant concerns   Jeffrey Rist MD [facility] 07/23/2021, 11:52 AM  CC: Jeffrey Alar, NP

## 2021-07-26 ENCOUNTER — Telehealth: Payer: Self-pay | Admitting: Pulmonary Disease

## 2021-07-26 NOTE — Telephone Encounter (Signed)
Order placed yesterday and sent at 3:56 I have not got a response yet but I have sent it again

## 2021-07-26 NOTE — Telephone Encounter (Signed)
I have called [facility] and they have not received the order for the CPAP supplies.   She stated that it can be faxed to them at [phone]  ATTN INTAKE And they will be able to process this and reach out to the pt..  will forward to [facility]

## 2021-07-31 NOTE — Telephone Encounter (Signed)
Please advise if you have an update if order had been received.

## 2021-07-31 NOTE — Telephone Encounter (Signed)
ATC LVMTCB x 1  

## 2021-07-31 NOTE — Telephone Encounter (Signed)
Spoke with the pt  He has received his CPAP supplies  Nothing further needed

## 2021-07-31 NOTE — Telephone Encounter (Signed)
Yes they have the order if the patient wants to verify he needs to call this is the patient line that is local [phone]

## 2021-08-06 ENCOUNTER — Other Ambulatory Visit (HOSPITAL_BASED_OUTPATIENT_CLINIC_OR_DEPARTMENT_OTHER): Payer: Self-pay

## 2021-08-06 MED ORDER — MODERNA COVID-19 BIVAL BOOSTER 50 MCG/0.5ML IM SUSP
INTRAMUSCULAR | 0 refills | Status: DC
  Filled 2021-08-06: qty 0.5, 1d supply, fill #0

## 2021-11-14 ENCOUNTER — Ambulatory Visit: Payer: 59 | Admitting: Dermatology

## 2022-01-21 ENCOUNTER — Encounter: Payer: Self-pay | Admitting: Dermatology

## 2022-01-21 ENCOUNTER — Ambulatory Visit: Payer: 59 | Admitting: Dermatology

## 2022-01-21 DIAGNOSIS — L570 Actinic keratosis: Secondary | ICD-10-CM

## 2022-01-21 DIAGNOSIS — D2239 Melanocytic nevi of other parts of face: Secondary | ICD-10-CM

## 2022-01-21 DIAGNOSIS — D485 Neoplasm of uncertain behavior of skin: Secondary | ICD-10-CM

## 2022-01-21 DIAGNOSIS — Z1283 Encounter for screening for malignant neoplasm of skin: Secondary | ICD-10-CM

## 2022-01-21 DIAGNOSIS — L219 Seborrheic dermatitis, unspecified: Secondary | ICD-10-CM | POA: Diagnosis not present

## 2022-01-21 NOTE — Patient Instructions (Addendum)
Apply daily over the counter 1% hydrocortisone OINTMENT (NOT LOTION OR CREAM) to areas of concern  ? ? ?Biopsy, Surgery (Curettage) & Surgery (Excision) Aftercare Instructions ? ?1. Okay to remove bandage in 24 hours ? ?2. Wash area with soap and water ? ?3. Apply Vaseline to area twice daily until healed (Not Neosporin) ? ?4. Okay to cover with a Band-Aid to decrease the chance of infection or prevent irritation from clothing; also it's okay to uncover lesion at home. ? ?5. Suture instructions: return to our office in 7-10 or 10-14 days for a nurse visit for suture removal. Variable healing with sutures, if pain or itching occurs call our office. It's okay to shower or bathe 24 hours after sutures are given. ? ?6. The following risks may occur after a biopsy, curettage or excision: bleeding, scarring, discoloration, recurrence, infection (redness, yellow drainage, pain or swelling). ? ?7. For questions, concerns and results call our office at [facility] before 4pm & Friday before 3pm. Biopsy results will be available in 1 week. ? ?

## 2022-01-24 ENCOUNTER — Telehealth: Payer: Self-pay

## 2022-01-24 NOTE — Telephone Encounter (Signed)
-----   Message from Lavonna Monarch, MD sent at 01/24/2022  6:55 AM EDT ----- Please let the patient know this is considered to be a precancer with changes suggestive of concurrent wart virus.  Recheck in 6 months.

## 2022-01-24 NOTE — Telephone Encounter (Signed)
Phone call from patient returning our call. Pathology results given to patient and follow up appointment scheduled.

## 2022-01-24 NOTE — Telephone Encounter (Signed)
Phone call to patient with his pathology results. Voicemail left for patient to give the office a call back.  ?

## 2022-02-10 ENCOUNTER — Encounter: Payer: Self-pay | Admitting: Dermatology

## 2022-02-10 NOTE — Progress Notes (Signed)
   Follow-Up Visit   Subjective  Jeffrey Moses is a 56 y.o. male who presents for the following: Skin Problem (Eye redness and itchy it comes and goes maybe allergy per patient. Left side of nose lesion x3 years. Right temple possible isk he picks ).  General skin check, rashes face and penis, several bumps Location:  Duration:  Quality:  Associated Signs/Symptoms: Modifying Factors:  Severity:  Timing: Context:   Objective  Well appearing patient in no apparent distress; mood and affect are within normal limits. No atypical nevi or atypical pigmented lesions (all checked with dermoscopy).  Noted at the time of the visit.  1 possible facial nonmelanoma skin cancer will be biopsied.  Dorsal Penile Shaft, Right Lower Eyelid Subtle psoriasiform dermatitis mainly right periorbital and shaft of penis.  No involvement ears and scalp.    Left Alar Crease Slightly verrucous 3 mm pink waxy papule, rule out superficial carcinoma     Left Supratip of Nose 3 mm smooth pink papule.    A full examination was performed including scalp, head, eyes, ears, nose, lips, neck, chest, axillae, abdomen, back, buttocks, bilateral upper extremities, bilateral lower extremities, hands, feet, fingers, toes, fingernails, and toenails. All findings within normal limits unless otherwise noted below.   Assessment & Plan    Seborrheic dermatitis Right Lower Eyelid; Dorsal Penile Shaft  Hydrocortisone ointment 1% over the counter to face and penis. If not helping we can change to tacrolimus ointment.  Follow-up by MyChart or phone in 4 weeks.  Screening exam for skin cancer  Neoplasm of uncertain behavior of skin Left Alar Crease  Skin / nail biopsy Type of biopsy: tangential   Informed consent: discussed and consent obtained   Timeout: patient name, date of birth, surgical site, and procedure verified   Anesthesia: the lesion was anesthetized in a standard fashion   Anesthetic:  1%  lidocaine w/ epinephrine 1-100,000 local infiltration Instrument used: flexible razor blade   Hemostasis achieved with: aluminum chloride and electrodesiccation   Outcome: patient tolerated procedure well   Post-procedure details: wound care instructions given    Specimen 1 - Surgical pathology Differential Diagnosis: bcc scc wart  Check Margins: No  Fibrous papule of nose Left Supratip of Nose  Told of similar appearance of early BCC so there is growth or bleeding return for biopsy      I, Lavonna Monarch, MD, have reviewed all documentation for this visit.  The documentation on 02/10/22 for the exam, diagnosis, procedures, and orders are all accurate and complete.

## 2022-08-07 ENCOUNTER — Ambulatory Visit: Payer: 59 | Admitting: Dermatology

## 2023-02-17 ENCOUNTER — Other Ambulatory Visit: Payer: Self-pay | Admitting: Orthopedic Surgery

## 2023-02-17 DIAGNOSIS — M545 Low back pain, unspecified: Secondary | ICD-10-CM

## 2023-02-17 DIAGNOSIS — R29898 Other symptoms and signs involving the musculoskeletal system: Secondary | ICD-10-CM

## 2023-02-22 ENCOUNTER — Ambulatory Visit
Admission: RE | Admit: 2023-02-22 | Discharge: 2023-02-22 | Disposition: A | Discharge status: Home / Self Care | Payer: 59 | Source: Ambulatory Visit | Attending: Orthopedic Surgery | Admitting: Orthopedic Surgery

## 2023-02-22 DIAGNOSIS — M545 Low back pain, unspecified: Secondary | ICD-10-CM

## 2023-02-22 DIAGNOSIS — R29898 Other symptoms and signs involving the musculoskeletal system: Secondary | ICD-10-CM

## 2023-07-11 ENCOUNTER — Other Ambulatory Visit (HOSPITAL_BASED_OUTPATIENT_CLINIC_OR_DEPARTMENT_OTHER): Payer: Self-pay

## 2023-07-11 ENCOUNTER — Encounter: Payer: Self-pay | Admitting: Family

## 2023-07-11 ENCOUNTER — Ambulatory Visit (INDEPENDENT_AMBULATORY_CARE_PROVIDER_SITE_OTHER): Payer: 59 | Admitting: Family

## 2023-07-11 VITALS — BP 121/68 | HR 86 | Temp 98.2°F | Resp 16 | Ht 74.0 in | Wt 230.0 lb

## 2023-07-11 DIAGNOSIS — E785 Hyperlipidemia, unspecified: Secondary | ICD-10-CM

## 2023-07-11 DIAGNOSIS — Z0000 Encounter for general adult medical examination without abnormal findings: Secondary | ICD-10-CM

## 2023-07-11 DIAGNOSIS — Z114 Encounter for screening for human immunodeficiency virus [HIV]: Secondary | ICD-10-CM

## 2023-07-11 DIAGNOSIS — E890 Postprocedural hypothyroidism: Secondary | ICD-10-CM

## 2023-07-11 DIAGNOSIS — Z1159 Encounter for screening for other viral diseases: Secondary | ICD-10-CM

## 2023-07-11 DIAGNOSIS — G4730 Sleep apnea, unspecified: Secondary | ICD-10-CM

## 2023-07-11 DIAGNOSIS — G629 Polyneuropathy, unspecified: Secondary | ICD-10-CM

## 2023-07-11 MED ORDER — COMIRNATY 30 MCG/0.3ML IM SUSY
PREFILLED_SYRINGE | Freq: Once | INTRAMUSCULAR | 0 refills | Status: AC
Start: 2023-07-11 — End: 2023-07-12
  Filled 2023-07-11: qty 0.3, 1d supply, fill #0

## 2023-07-11 NOTE — Progress Notes (Signed)
Subjective:     Patient ID: Jeffrey Moses, male    DOB: February 20, 1966, 57 y.o.   MRN: [id number]  Chief Complaint  Patient presents with   Annual Exam    HPI  Discussed the use of AI scribe software for clinical note transcription with the patient, who gave verbal consent to proceed.   Flu shot completed  Covid booster is going to get talk to phamracy  Vision checked last year  Dentist tis month   Colonoscopy done 2020       Health Maintenance Due  Topic Date Due   HIV Screening  Never done   Hepatitis C Screening  Never done   COVID-19 Vaccine (4 - 2023-24 season) 05/11/2023    Past Medical History:  Diagnosis Date   Chewing tobacco use    history of chewing tobacco x 17 years   Heart block, first degree    patient denies this dx- patient states "no heart problems"   History of thyroid nodule 2006   benign   Sleep apnea    mild, uses CPAP nightly    Past Surgical History:  Procedure Laterality Date   MENISCUS REPAIR Left    2022   THYROIDECTOMY, PARTIAL  2006   TONSILLECTOMY      Family History  Problem Relation Age of Onset   Diabetes Other    Cancer Other        skin   Colon polyps Neg Hx    Rectal cancer Neg Hx    Stomach cancer Neg Hx    Crohn's disease Neg Hx     Social History   Socioeconomic History   Marital status: Married    Spouse name: Not on file   Number of children: Not on file   Years of education: Not on file   Highest education level: Not on file  Occupational History   Not on file  Tobacco Use   Smoking status: Never   Smokeless tobacco: Former    Types: Chew    Quit date: 09/10/1999  Vaping Use   Vaping status: Never Used  Substance and Sexual Activity   Alcohol use: Yes    Alcohol/week: 0.0 standard drinks of alcohol    Comment: seldom (1-2 per month)    Drug use: No   Sexual activity: Yes    Partners: Female  Other Topics Concern   Not on file  Social History Narrative     Married      1998-  daughter   39- 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  Quit 2001    Social Determinants of Health   Financial Resource Strain: Not on file  Food Insecurity: Not on file  Transportation Needs: Not on file  Physical Activity: Not on file  Stress: Not on file  Social Connections: Not on file  Intimate Partner Violence: Not on file    Outpatient Medications Prior to Visit  Medication Sig Dispense Refill   Multiple Vitamin (MULTIVITAMIN WITH MINERALS) TABS tablet Take 1 tablet by mouth daily.     COVID-19 mRNA bivalent vaccine, Moderna, (MODERNA COVID-19 BIVAL BOOSTER) 50 MCG/0.5ML injection Inject into the muscle. (Patient not taking: Reported on 01/21/2022) 0.5 mL 0   No facility-administered medications prior to visit.    No Known Allergies  Review of Systems  Constitutional:  Negative for chills, fever and  weight loss.  HENT:  Negative for congestion and nosebleeds.   Eyes:  Negative for pain.  Respiratory:  Negative for cough, shortness of breath and wheezing.   Cardiovascular:  Negative for chest pain and palpitations.  Gastrointestinal:  Negative for diarrhea, nausea and vomiting.  Genitourinary:  Negative for dysuria and urgency.  Musculoskeletal:  Negative for back pain and neck pain.  Skin:  Negative for itching and rash.  Neurological:  Negative for dizziness and headaches.  Psychiatric/Behavioral:  Negative for depression.        Objective:    Physical Exam Constitutional:      Appearance: Normal appearance.  HENT:     Head: Normocephalic.  Cardiovascular:     Rate and Rhythm: Normal rate and regular rhythm.     Pulses: Normal pulses.     Heart sounds: Normal heart sounds.  Pulmonary:     Effort: Pulmonary effort is normal.     Breath sounds: Normal breath sounds.  Musculoskeletal:        General: Normal range of motion.     Cervical back: Normal range of motion.  Neurological:      General: No focal deficit present.     Mental Status: He is alert and oriented to person, place, and time. Mental status is at baseline.  Psychiatric:        Mood and Affect: Mood normal.        Behavior: Behavior normal.        Thought Content: Thought content normal.        Judgment: Judgment normal.      BP 121/68 (BP Location: Right Arm, Patient Position: Sitting, Cuff Size: Large)   Pulse 86   Temp 98.2 F (36.8 C) (Oral)   Resp 16   Ht 6\' 2"  (1.88 m)   Wt 230 lb (104.3 kg)   SpO2 96%   BMI 29.53 kg/m  Wt Readings from Last 3 Encounters:  07/11/23 230 lb (104.3 kg)  07/23/21 235 lb 3.2 oz (106.7 kg)  07/17/21 235 lb (106.6 kg)       Assessment & Plan:   Problem List Items Addressed This Visit   None   I have discontinued [name]'s Moderna COVID-19 Bival Booster. I am also having him maintain his multivitamin with minerals.  No orders of the defined types were placed in this encounter.

## 2023-07-11 NOTE — Assessment & Plan Note (Signed)
Lab Results  Component Value Date   CHOL 268 (H) 07/17/2021   HDL 46.20 07/17/2021   LDLDIRECT 176.0 07/17/2021   TRIG 300.0 (H) 07/17/2021   CHOLHDL 6 07/17/2021   Update lipid panel.  Fair diet.

## 2023-07-11 NOTE — Assessment & Plan Note (Signed)
This is being evaluated by ortho and neuro.  He has decreased strength with pedal flexion.  I recommended that he start PT which plans to do through his ortho.

## 2023-07-11 NOTE — Assessment & Plan Note (Addendum)
Discussed healthy diet, exercise, weight loss. Flu shot today.  Recommend covid booster at the pharmacy. Colo up to date. Check PSA.

## 2023-07-11 NOTE — Assessment & Plan Note (Signed)
Continues cpap.  

## 2023-07-11 NOTE — Patient Instructions (Signed)
VISIT SUMMARY:  Today, we discussed your ongoing issues with lower extremity weakness and high cholesterol. We also reviewed your general health maintenance and planned necessary screenings and follow-ups.  YOUR PLAN:  -HYPERLIPIDEMIA: Hyperlipidemia means having high levels of cholesterol in your blood, which can increase the risk of heart disease. We discussed the importance of maintaining a healthy diet and regular exercise to manage your cholesterol levels. We will recheck your lipid panel fasting and calculate your cardiovascular risk to determine if you need medication. Please continue with your current diet and exercise regimen.  -UNEXPLAINED LOWER EXTREMITY WEAKNESS: You have been experiencing a loss of strength in your toes, which has affected your ability to stand on your toes and your gait. Despite seeing multiple specialists, the cause remains unclear. We will check your B12 and folate levels as these can affect nerve health. Please continue with your follow up with your specialists and consider beginning physical therapy.   -GENERAL HEALTH MAINTENANCE: Your routine health screenings are mostly up to date. We will order additional screenings for Hepatitis C and HIV, as well as tests for glucose levels, thyroid function, and prostate health. Please ensure you stay current with your vision and dental check-ups.  INSTRUCTIONS:  Please complete the following lab tests: lipid panel, B12 and folate levels, Hepatitis C and HIV screening, A1c and CMET for glucose, PSA, and thyroid function tests. Schedule a follow-up appointment in one year.

## 2023-07-11 NOTE — Progress Notes (Signed)
Subjective:     Patient ID: Jeffrey Moses, male    DOB: Jul 14, 1966, 57 y.o.   MRN: [id number]  Chief Complaint  Patient presents with   Annual Exam    HPI  Discussed the use of AI scribe software for clinical note transcription with the patient, who gave verbal consent to proceed.  History of Present Illness    Jeffrey Moses, a patient with a history of high cholesterol and sleep apnea managed with a CPAP machine, presents for annual cpx.  He reports a unique issue of loss of strength in his toes. Notes that he can't stand on his toes unless he is in the pool.  This condition has been ongoing for several years and has progressively worsened.Marland Kitchen Despite multiple consultations with neurologists and orthopedists, and undergoing various tests including MRIs and nerve conduction studies, the cause of his condition remains undetermined. The patient reports that this issue affects his gait and he walks flat-footed. He also notes that his shoes wear out quickly, particularly on the insoles of his left foot.  In addition to this, Jeffrey Moses discusses his struggle with high cholesterol levels. Despite his active lifestyle, which includes participating in sprint triathlons, he acknowledges that his diet needs improvement. He is aware that weight loss could potentially improve his sleep apnea.      Patient presents today for complete physical.  Lab Results  Component Value Date   CHOL 268 (H) 07/17/2021   HDL 46.20 07/17/2021   LDLDIRECT 176.0 07/17/2021   TRIG 300.0 (H) 07/17/2021   CHOLHDL 6 07/17/2021      Health Maintenance Due  Topic Date Due   HIV Screening  Never done   Hepatitis C Screening  Never done    Past Medical History:  Diagnosis Date   Chewing tobacco use    history of chewing tobacco x 17 years   Heart block, first degree    patient denies this dx- patient states "no heart problems"   History of thyroid nodule 2006   benign   Sleep apnea    mild, uses CPAP  nightly    Past Surgical History:  Procedure Laterality Date   MENISCUS REPAIR Left    2022   THYROIDECTOMY, PARTIAL  2006   TONSILLECTOMY      Family History  Problem Relation Age of Onset   Diabetes Other    Cancer Other        skin   Colon polyps Neg Hx    Rectal cancer Neg Hx    Stomach cancer Neg Hx    Crohn's disease Neg Hx     Social History   Socioeconomic History   Marital status: Married    Spouse name: Not on file   Number of children: Not on file   Years of education: Not on file   Highest education level: Not on file  Occupational History   Not on file  Tobacco Use   Smoking status: Never   Smokeless tobacco: Former    Types: Chew    Quit date: 09/10/1999  Vaping Use   Vaping status: Never Used  Substance and Sexual Activity   Alcohol use: Yes    Alcohol/week: 0.0 standard drinks of alcohol    Comment: seldom (1-2 per month)    Drug use: No   Sexual activity: Yes    Partners: Female  Other Topics Concern   Not on file  Social History Narrative     Married      1998-  daughter   35- 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  Quit 2001    Social Determinants of Health   Financial Resource Strain: Not on file  Food Insecurity: Not on file  Transportation Needs: Not on file  Physical Activity: Not on file  Stress: Not on file  Social Connections: Not on file  Intimate Partner Violence: Not on file    Outpatient Medications Prior to Visit  Medication Sig Dispense Refill   Multiple Vitamin (MULTIVITAMIN WITH MINERALS) TABS tablet Take 1 tablet by mouth daily.     COVID-19 mRNA bivalent vaccine, Moderna, (MODERNA COVID-19 BIVAL BOOSTER) 50 MCG/0.5ML injection Inject into the muscle. (Patient not taking: Reported on 01/21/2022) 0.5 mL 0   No facility-administered medications prior to visit.    No Known Allergies  ROS    See HPI Objective:    Physical Exam   BP 121/68 (BP  Location: Right Arm, Patient Position: Sitting, Cuff Size: Large)   Pulse 86   Temp 98.2 F (36.8 C) (Oral)   Resp 16   Ht 6\' 2"  (1.88 m)   Wt 230 lb (104.3 kg)   SpO2 96%   BMI 29.53 kg/m  Wt Readings from Last 3 Encounters:  07/11/23 230 lb (104.3 kg)  07/23/21 235 lb 3.2 oz (106.7 kg)  07/17/21 235 lb (106.6 kg)  Physical Exam  Constitutional: He is oriented to person, place, and time. He appears well-developed and well-nourished. No distress.  HENT:  Head: Normocephalic and atraumatic.  Right Ear: Tympanic membrane and ear canal normal.  Left Ear: Tympanic membrane and ear canal normal.  Mouth/Throat: Oropharynx is clear and moist.  Eyes: Pupils are equal, round, and reactive to light. No scleral icterus.  Neck: Normal range of motion. No thyromegaly present.  Cardiovascular: Normal rate and regular rhythm.   No murmur heard. Pulmonary/Chest: Effort normal and breath sounds normal. No respiratory distress. He has no wheezes. He has no rales. He exhibits no tenderness.  Abdominal: Soft. Bowel sounds are normal. He exhibits no distension and no mass. There is no tenderness. There is no rebound and no guarding.  Musculoskeletal: He exhibits no edema.  Lymphadenopathy:    He has no cervical adenopathy.  Neurological: He is alert and oriented to person, place, and time. He has normal patellar reflexes. He exhibits normal muscle tone. Coordination normal.  Skin: Skin is warm and dry.  Psychiatric: He has a normal mood and affect. His behavior is normal. Judgment and thought content normal.           Assessment & Plan:        Assessment & Plan:   Problem List Items Addressed This Visit       Unprioritized   Sleep apnea    Continues cpap.       Preventative health care - Primary    Discussed healthy diet, exercise, weight loss. Flu shot today.  Recommend covid booster at the pharmacy. Colo up to date. Check PSA.       Relevant Orders   HgB A1c   Neuropathy    Relevant Orders   B12   Folate   Hyperlipidemia    Lab Results  Component Value Date   CHOL 268 (H) 07/17/2021   HDL 46.20 07/17/2021   LDLDIRECT 176.0 07/17/2021   TRIG 300.0 (H) 07/17/2021   CHOLHDL 6 07/17/2021  Update lipid panel.  Fair diet.      Relevant Orders   Lipid panel   PSA   Comp Met (CMET)   Other Visit Diagnoses     Encounter for hepatitis C screening test for low risk patient       Relevant Orders   Hepatitis C Antibody   Encounter for screening for HIV       Relevant Orders   HIV antibody (with reflex)   H/O partial thyroidectomy       Relevant Orders   TSH       I have discontinued Jeffrey Moses. Jeffrey Moses's Moderna COVID-19 Bival Booster. I am also having him maintain his multivitamin with minerals.  No orders of the defined types were placed in this encounter.

## 2023-07-15 ENCOUNTER — Telehealth: Payer: Self-pay | Admitting: Family

## 2023-07-15 ENCOUNTER — Other Ambulatory Visit (INDEPENDENT_AMBULATORY_CARE_PROVIDER_SITE_OTHER): Payer: 59

## 2023-07-15 DIAGNOSIS — G629 Polyneuropathy, unspecified: Secondary | ICD-10-CM | POA: Diagnosis not present

## 2023-07-15 DIAGNOSIS — Z114 Encounter for screening for human immunodeficiency virus [HIV]: Secondary | ICD-10-CM

## 2023-07-15 DIAGNOSIS — E890 Postprocedural hypothyroidism: Secondary | ICD-10-CM

## 2023-07-15 DIAGNOSIS — Z1159 Encounter for screening for other viral diseases: Secondary | ICD-10-CM

## 2023-07-15 DIAGNOSIS — Z131 Encounter for screening for diabetes mellitus: Secondary | ICD-10-CM | POA: Diagnosis not present

## 2023-07-15 DIAGNOSIS — E785 Hyperlipidemia, unspecified: Secondary | ICD-10-CM

## 2023-07-15 DIAGNOSIS — Z0000 Encounter for general adult medical examination without abnormal findings: Secondary | ICD-10-CM

## 2023-07-15 LAB — LIPID PANEL
Cholesterol: 224 mg/dL — ABNORMAL HIGH (ref 0–200)
HDL: 46.4 mg/dL (ref >39.00)
LDL Cholesterol: 152 mg/dL — ABNORMAL HIGH (ref 0–99)
NonHDL: 177.47
Total CHOL/HDL Ratio: 5
Triglycerides: 126 mg/dL (ref 0.0–149.0)
VLDL: 25.2 mg/dL (ref 0.0–40.0)

## 2023-07-15 LAB — COMPREHENSIVE METABOLIC PANEL
ALT: 20 U/L (ref 0–53)
AST: 22 U/L (ref 0–37)
Albumin: 4.3 g/dL (ref 3.5–5.2)
Alkaline Phosphatase: 46 U/L (ref 39–117)
BUN: 12 mg/dL (ref 6–23)
CO2: 26 meq/L (ref 19–32)
Calcium: 9.4 mg/dL (ref 8.4–10.5)
Chloride: 104 meq/L (ref 96–112)
Creatinine, Ser: 0.94 mg/dL (ref 0.40–1.50)
GFR: 91.5 mL/min (ref >60.00)
Glucose, Bld: 95 mg/dL (ref 70–99)
Potassium: 4.2 meq/L (ref 3.5–5.1)
Sodium: 138 meq/L (ref 135–145)
Total Bilirubin: 0.8 mg/dL (ref 0.2–1.2)
Total Protein: 7.2 g/dL (ref 6.0–8.3)

## 2023-07-15 LAB — TSH: TSH: 2.54 u[IU]/mL (ref 0.35–5.50)

## 2023-07-15 LAB — HEMOGLOBIN A1C: Hgb A1c MFr Bld: 5.5 % (ref 4.6–6.5)

## 2023-07-15 LAB — VITAMIN B12: Vitamin B-12: 294 pg/mL (ref 211–911)

## 2023-07-15 LAB — FOLATE: Folate: 12.4 ng/mL (ref >5.9)

## 2023-07-15 LAB — PSA: PSA: 2.66 ng/mL (ref 0.10–4.00)

## 2023-07-15 MED ORDER — VITAMIN B-12 1000 MCG PO TABS: 1000.0000 ug | ORAL_TABLET | Freq: Every day | ORAL | Status: AC

## 2023-07-15 NOTE — Telephone Encounter (Signed)
B12 is low normal. Given his issues with the strength in his feet, I would recommend that he add over the counter b12 1000 mcg PO once daily. Repeat b12 in 6 weeks.   Cholesterol is elevated but improved from last check.  Please continue to work on healthy diet and exercise.

## 2023-07-15 NOTE — Addendum Note (Signed)
Addended by: Mertha Finders on: 07/15/2023 08:46 AM   Modules accepted: Orders

## 2023-07-16 LAB — HEPATITIS C ANTIBODY: Hepatitis C Ab: NONREACTIVE

## 2023-07-16 LAB — HIV ANTIBODY (ROUTINE TESTING W REFLEX): HIV 1&2 Ab, 4th Generation: NONREACTIVE

## 2023-07-17 NOTE — Telephone Encounter (Signed)
Patient notified of results and provider's recommendations.  

## 2023-07-25 ENCOUNTER — Emergency Department (HOSPITAL_BASED_OUTPATIENT_CLINIC_OR_DEPARTMENT_OTHER)
Admission: EM | Admit: 2023-07-25 | Discharge: 2023-07-25 | Disposition: A | Discharge status: Home / Self Care | Payer: 59

## 2023-07-25 ENCOUNTER — Encounter (HOSPITAL_BASED_OUTPATIENT_CLINIC_OR_DEPARTMENT_OTHER): Payer: Self-pay | Admitting: Emergency Medicine

## 2023-07-25 ENCOUNTER — Other Ambulatory Visit: Payer: Self-pay

## 2023-07-25 DIAGNOSIS — H5789 Other specified disorders of eye and adnexa: Secondary | ICD-10-CM | POA: Diagnosis present

## 2023-07-25 DIAGNOSIS — H33311 Horseshoe tear of retina without detachment, right eye: Secondary | ICD-10-CM | POA: Insufficient documentation

## 2023-07-25 MED ORDER — FLUORESCEIN SODIUM 1 MG OP STRP
1.0000 | ORAL_STRIP | Freq: Once | OPHTHALMIC | Status: AC
  Administered 2023-07-25: 1 via OPHTHALMIC
  Filled 2023-07-25: qty 1

## 2023-07-25 MED ORDER — TETRACAINE HCL 0.5 % OP SOLN
1.0000 [drp] | Freq: Once | OPHTHALMIC | Status: AC
  Administered 2023-07-25: 1 [drp] via OPHTHALMIC
  Filled 2023-07-25: qty 4

## 2023-07-25 NOTE — ED Notes (Signed)
Pt. Does not wear contacts or glasses

## 2023-07-25 NOTE — ED Notes (Signed)
[facility] Ophthalmologist in room with patient.

## 2023-07-25 NOTE — ED Triage Notes (Signed)
Pt has been having flashes of light for about a week in R eye. Also c/o floaters, blurred vision. Was sent here from [facility] for possible torn retina. Denies any injury/trauma to R eye.

## 2023-07-25 NOTE — ED Provider Notes (Signed)
[place] EMERGENCY DEPARTMENT AT [facility] Provider Note   CSN: [id number] Arrival date & time: 07/25/23  1417     History  Chief Complaint  Patient presents with   Eye Problem    Jeffrey Moses is a 57 y.o. male.  57 year old male present emergency department for vision problems in his right eye.  Reports flashes and floaters for the past week.  Today had abrupt onset at 1:00 with dark spots/floaters in eyes.  Reports he can still see, but appears that he has dark objects in vision.  Occasional flashes.   Eye Problem      Home Medications Prior to Admission medications   Medication Sig Start Date End Date Taking? Authorizing Provider  cyanocobalamin (VITAMIN B12) 1000 MCG tablet Take 1 tablet (1,000 mcg total) by mouth daily. 07/15/23   Sandford Craze, NP  Multiple Vitamin (MULTIVITAMIN WITH MINERALS) TABS tablet Take 1 tablet by mouth daily.    [provider]      Allergies    Patient has no known allergies.    Review of Systems   Review of Systems  Physical Exam Updated Vital Signs BP 129/86   Pulse (!) 50   Temp 97.6 F (36.4 C) (Oral)   Resp 17   Ht 6\' 2"  (1.88 m)   Wt 98.9 kg   SpO2 99%   BMI 27.99 kg/m  Physical Exam HENT:     Head: Normocephalic.  Eyes:     Conjunctiva/sclera: Conjunctivae normal.     Pupils: Pupils are equal, round, and reactive to light.     Comments: 20/25 vision out of right eye.  No fluorescein uptake.  Pressure of 8.  Ultrasound of eye; see pictures below.  Cardiovascular:     Rate and Rhythm: Normal rate and regular rhythm.  Pulmonary:     Effort: Pulmonary effort is normal.  Abdominal:     General: Abdomen is flat.  Musculoskeletal:        General: Normal range of motion.  Skin:    General: Skin is warm.     Capillary Refill: Capillary refill takes less than 2 seconds.  Neurological:     General: No focal deficit present.     Mental Status: He is alert.  Psychiatric:        Mood  and Affect: Mood normal.        Behavior: Behavior normal.        ED Results / Procedures / Treatments   Labs (all labs ordered are listed, but only abnormal results are displayed) Labs Reviewed - No data to display  EKG None  Radiology No results found.  Procedures Procedures    Medications Ordered in ED Medications  tetracaine (PONTOCAINE) 0.5 % ophthalmic solution 1 drop (1 drop Right Eye Given by Other 07/25/23 1734)  fluorescein ophthalmic strip 1 strip (1 strip Both Eyes Given by Other 07/25/23 1735)    ED Course/ Medical Decision Making/ A&P Clinical Course as of 07/25/23 1944  Fri Jul 25, 2023  1750 Consulted ophthalmology, they will see patient here in the emergency department. [TY]  [id number] Ophthalmology recommending discharge and following up with [facility] retinal surgeon tomorrow.  Appointment set.  Patient stable for discharge at this time. [TY]    Clinical Course User Index [TY] Coral Spikes, DO  Medical Decision Making 57 year old male present emergency department with acute onset of flashes and floaters in his eye today.  He is afebrile vital signs reassuring.  Concern for possible retinal tear/detachment versus vitreous hemorrhage given ultrasound images.  Consulted ophthalmology who evaluated patient.  Patient has retinal tear.  Follow-up tomorrow with retinal surgeon at [facility].  Stable for discharge at this time.  Risk Prescription drug management.         Final Clinical Impression(s) / ED Diagnoses Final diagnoses:  None    Rx / DC Orders ED Discharge Orders     None         Coral Spikes, DO 07/25/23 1944

## 2023-07-25 NOTE — ED Notes (Signed)
Reviewed AVS with patient, patient expressed understanding of directions, denies further questions at this time. 

## 2023-07-25 NOTE — Discharge Instructions (Signed)
Go to the retinal specialist as discussed with ophthalmology.  Return immediately for fevers, chills, sudden onset headache, vision loss or any new or worsening symptoms that are concerning to you.

## 2023-07-26 NOTE — Consult Note (Signed)
Ophthalmology Consult Note  Subjective: Patient reports flashes of light and floaters x 2 weeks. Around 1:00 pm today patient reports a large black blob in his vision.  Objective: Vital signs in last 24 hours: Temp:  [97.1 F (36.2 C)-97.6 F (36.4 C)] 97.6 F (36.4 C) (11/15 1626) Pulse Rate:  [50-63] 51 (11/15 1945) Resp:  [16-20] 16 (11/15 1945) BP: (116-145)/(78-87) 131/83 (11/15 1945) SpO2:  [96 %-100 %] 98 % (11/15 1945) Weight:  [98.9 kg] 98.9 kg (11/15 1458) Weight change:     Intake/Output from previous day: No intake/output data recorded. Intake/Output this shift: No intake/output data recorded.  Base Eye Exam       Visual Acuity (ETDRS)     Right Left  Near  20/30 20/25  Dist ph         Tonometry (Tonopen)     Right Left  Pressure 13 14   Pupils     APD  Right None  Left None    Visual Fields     Right Left    Full Full    Extraocular Movement     Right Left    Full Full    Neuro/Psych   Oriented x3: Yes  Mood/Affect: Normal              Slit Lamp and Fundus Exam     External Exam     Right Left  External Normal Normal    Slit Lamp Exam     Right Left  Lids/Lashes Normal Normal  Conjunctiva/Sclera White and quiet White and quiet  Cornea Clear Clear  Anterior Chamber Deep and quiet Deep and quiet  Iris Grossly normal Grossly normal  Lens Clear Clear           Fundus Exam (dilation OS defered     Right Clear  Posterior Vitreous Clear   Disc Pink, sharp margins    C/D Ratio     Macula Vitreous hemorrhage over optic nerve and macula    Vessels Normal course and caliber    Periphery HST at 1:30      No results for input(s): "WBC", "HGB", "HCT", "NA", "K", "CL", "CO2", "BUN", "CREATININE", "GLU" in the last 72 hours.  Invalid input(s): "PLATELETS"  Studies/Results: No results found.  Medications: I have reviewed the patient's current medications.  Assessment/Plan:  Retinal Tear, right eye -Educated patient  on findings and need for barrier laser to prevent retinal detachment. Spoke to retina specialist, Dr. Gae Bon at [facility] who will be able to perform laser retinopexy outpatient tomorrow.  Vitreous hemorrhage, right eye -Secondary to PVD and retinal tear. See above.   LOS: 0 days   Jeffrey Moses 07/26/2023

## 2023-11-26 ENCOUNTER — Telehealth: Payer: Self-pay | Admitting: *Deleted

## 2023-11-26 ENCOUNTER — Encounter: Payer: Self-pay | Admitting: Family

## 2023-11-26 DIAGNOSIS — E785 Hyperlipidemia, unspecified: Secondary | ICD-10-CM

## 2023-11-26 MED ORDER — ATORVASTATIN CALCIUM 20 MG PO TABS: 20.0000 mg | ORAL_TABLET | Freq: Every day | ORAL | 1 refills | Status: DC

## 2023-11-26 NOTE — Telephone Encounter (Signed)
 Copied from CRM [id number]. Topic: General - Other >> Nov 26, 2023  9:31 AM Eunice Blase wrote: Reason for CRM: Pt called and wants to know what is the next step for lowering his cholesterol? Please call pt at [phone].

## 2023-11-27 NOTE — Telephone Encounter (Signed)
 Patient was advised, documented on another phone note

## 2024-02-17 NOTE — Addendum Note (Signed)
 Addended by: Delayne Feather on: 02/17/2024 04:13 PM   Modules accepted: Orders

## 2024-02-18 ENCOUNTER — Other Ambulatory Visit (INDEPENDENT_AMBULATORY_CARE_PROVIDER_SITE_OTHER)

## 2024-02-18 ENCOUNTER — Ambulatory Visit: Payer: Self-pay | Admitting: Family

## 2024-02-18 DIAGNOSIS — E785 Hyperlipidemia, unspecified: Secondary | ICD-10-CM

## 2024-02-18 LAB — LIPID PANEL
Cholesterol: 141 mg/dL (ref 0–200)
HDL: 52.3 mg/dL (ref >39.00)
LDL Cholesterol: 72 mg/dL (ref 0–99)
NonHDL: 88.95
Total CHOL/HDL Ratio: 3
Triglycerides: 84 mg/dL (ref 0.0–149.0)
VLDL: 16.8 mg/dL (ref 0.0–40.0)

## 2024-04-24 ENCOUNTER — Other Ambulatory Visit: Payer: Self-pay | Admitting: Family

## 2024-10-15 ENCOUNTER — Other Ambulatory Visit (HOSPITAL_BASED_OUTPATIENT_CLINIC_OR_DEPARTMENT_OTHER): Payer: Self-pay | Admitting: Family Medicine

## 2024-10-15 DIAGNOSIS — Z8249 Family history of ischemic heart disease and other diseases of the circulatory system: Secondary | ICD-10-CM
